# Patient Record
Sex: Female | Born: 1955 | Race: White | Hispanic: No | Marital: Married | Smoking: Never smoker
Health system: Southern US, Community
[De-identification: ages and names within clinical notes are randomized; demographics above are authoritative.]

## PROBLEM LIST (undated history)

## (undated) DIAGNOSIS — E0590 Thyrotoxicosis, unspecified without thyrotoxic crisis or storm: Secondary | ICD-10-CM

## (undated) DIAGNOSIS — I4819 Other persistent atrial fibrillation: Secondary | ICD-10-CM

## (undated) DIAGNOSIS — I480 Paroxysmal atrial fibrillation: Secondary | ICD-10-CM

## (undated) DIAGNOSIS — I4729 Other ventricular tachycardia: Secondary | ICD-10-CM

## (undated) DIAGNOSIS — I491 Atrial premature depolarization: Secondary | ICD-10-CM

## (undated) DIAGNOSIS — I2510 Atherosclerotic heart disease of native coronary artery without angina pectoris: Secondary | ICD-10-CM

## (undated) DIAGNOSIS — I493 Ventricular premature depolarization: Secondary | ICD-10-CM

## (undated) DIAGNOSIS — I4719 Other supraventricular tachycardia: Secondary | ICD-10-CM

## (undated) DIAGNOSIS — E7800 Pure hypercholesterolemia, unspecified: Secondary | ICD-10-CM

## (undated) DIAGNOSIS — I471 Supraventricular tachycardia: Secondary | ICD-10-CM

## (undated) HISTORY — DX: Paroxysmal atrial fibrillation: I48.0

## (undated) HISTORY — DX: Thyrotoxicosis, unspecified without thyrotoxic crisis or storm: E05.90

## (undated) HISTORY — DX: Supraventricular tachycardia: I47.1

## (undated) HISTORY — DX: Ventricular premature depolarization: I49.3

## (undated) HISTORY — DX: Atrial premature depolarization: I49.1

## (undated) HISTORY — DX: Atherosclerotic heart disease of native coronary artery without angina pectoris: I25.10

## (undated) HISTORY — DX: Other persistent atrial fibrillation: I48.19

## (undated) HISTORY — DX: Other supraventricular tachycardia: I47.19

## (undated) HISTORY — DX: Other ventricular tachycardia: I47.29

## (undated) HISTORY — PX: OTHER SURGICAL HISTORY: SHX169

---

## 2005-12-29 ENCOUNTER — Other Ambulatory Visit: Admission: RE | Admit: 2005-12-29 | Discharge: 2005-12-29 | Payer: Self-pay | Admitting: Family Medicine

## 2006-11-25 ENCOUNTER — Encounter: Admission: RE | Admit: 2006-11-25 | Discharge: 2006-11-25 | Payer: Self-pay | Admitting: Otolaryngology

## 2008-07-21 ENCOUNTER — Other Ambulatory Visit: Admission: RE | Admit: 2008-07-21 | Discharge: 2008-07-21 | Payer: Self-pay | Admitting: Family Medicine

## 2011-09-04 ENCOUNTER — Other Ambulatory Visit: Payer: Self-pay | Admitting: Family Medicine

## 2011-09-04 DIAGNOSIS — Z1231 Encounter for screening mammogram for malignant neoplasm of breast: Secondary | ICD-10-CM

## 2011-09-25 ENCOUNTER — Ambulatory Visit
Admission: RE | Admit: 2011-09-25 | Discharge: 2011-09-25 | Disposition: A | Discharge status: Home / Self Care | Payer: Commercial Indemnity | Source: Ambulatory Visit | Attending: Family Medicine | Admitting: Family Medicine

## 2011-09-25 DIAGNOSIS — Z1231 Encounter for screening mammogram for malignant neoplasm of breast: Secondary | ICD-10-CM

## 2012-12-17 ENCOUNTER — Other Ambulatory Visit: Payer: Self-pay | Admitting: Family Medicine

## 2012-12-17 DIAGNOSIS — Z1231 Encounter for screening mammogram for malignant neoplasm of breast: Secondary | ICD-10-CM

## 2013-01-17 ENCOUNTER — Ambulatory Visit
Admission: RE | Admit: 2013-01-17 | Discharge: 2013-01-17 | Disposition: A | Discharge status: Home / Self Care | Payer: Commercial Indemnity | Source: Ambulatory Visit | Attending: Family Medicine | Admitting: Family Medicine

## 2013-01-17 DIAGNOSIS — Z1231 Encounter for screening mammogram for malignant neoplasm of breast: Secondary | ICD-10-CM

## 2014-01-02 ENCOUNTER — Other Ambulatory Visit (HOSPITAL_COMMUNITY)
Admission: RE | Admit: 2014-01-02 | Discharge: 2014-01-02 | Disposition: A | Discharge status: Home / Self Care | Payer: Commercial Indemnity | Source: Ambulatory Visit | Attending: Family Medicine | Admitting: Family Medicine

## 2014-01-02 ENCOUNTER — Other Ambulatory Visit: Payer: Self-pay | Admitting: Family Medicine

## 2014-01-02 DIAGNOSIS — Z0000 Encounter for general adult medical examination without abnormal findings: Secondary | ICD-10-CM | POA: Insufficient documentation

## 2014-01-24 ENCOUNTER — Other Ambulatory Visit: Payer: Self-pay

## 2014-01-24 DIAGNOSIS — Z1231 Encounter for screening mammogram for malignant neoplasm of breast: Secondary | ICD-10-CM

## 2014-02-13 ENCOUNTER — Ambulatory Visit
Admission: RE | Admit: 2014-02-13 | Discharge: 2014-02-13 | Disposition: A | Discharge status: Home / Self Care | Payer: Commercial Indemnity | Source: Ambulatory Visit

## 2014-02-13 DIAGNOSIS — Z1231 Encounter for screening mammogram for malignant neoplasm of breast: Secondary | ICD-10-CM

## 2014-10-31 ENCOUNTER — Ambulatory Visit (INDEPENDENT_AMBULATORY_CARE_PROVIDER_SITE_OTHER): Payer: Commercial Indemnity

## 2014-10-31 ENCOUNTER — Encounter: Payer: Self-pay | Admitting: Podiatry

## 2014-10-31 ENCOUNTER — Ambulatory Visit (INDEPENDENT_AMBULATORY_CARE_PROVIDER_SITE_OTHER): Payer: Commercial Indemnity | Admitting: Podiatry

## 2014-10-31 VITALS — BP 132/74 | HR 71 | Resp 16

## 2014-10-31 DIAGNOSIS — M722 Plantar fascial fibromatosis: Secondary | ICD-10-CM

## 2014-10-31 MED ORDER — MELOXICAM 15 MG PO TABS: 15.0000 mg | ORAL_TABLET | Freq: Every day | ORAL | Status: DC

## 2014-10-31 MED ORDER — METHYLPREDNISOLONE (PAK) 4 MG PO TABS: ORAL_TABLET | ORAL | Status: DC

## 2014-10-31 NOTE — Patient Instructions (Signed)

## 2014-10-31 NOTE — Progress Notes (Signed)
   Subjective:    Patient ID: Julia Wilkerson, female    DOB: 1955-12-01, 58 y.o.   MRN: [id number]  HPI Comments: "I have pain in the heels"  Patient c/o aching plantar heel bilateral, left over right for a few months. She has AM pain. PCP Rx'd naprosyn-helped initially but not now. She has also tried ice, stretching, different shoes. Some better.     Review of Systems  HENT: Positive for hearing loss and tinnitus.   Neurological: Positive for dizziness.  All other systems reviewed and are negative.      Objective:   Physical Exam: I have reviewed her past medical history medications allergies surgery social history and review of systems. Pulses are strongly palpable bilateral. Neurologic sensorium is intact per Semmes-Weinstein monofilament. Deep tendon reflexes are intact bilateral muscle strength is 5 over 5 dorsiflexion plantar flexors and everters and inverters and his musculature is intact. Orthopedic evaluation of his drains all joints distal to the ankle range of motion without crepitation. She has pain on palpation medially continue to remove all the bilateral heels left greater than right. Radiographs evaluation confirm soft tissue increase in density of plantar fascial insertion site.        Assessment & Plan:  Assessment chronic proximal plantar fasciitis bilateral.  Plan: Start her on a Medrol Dosepak to be followed by meloxicam. Injected the bilateral heels today with Kenalog and local anesthetic. Bilateral plantar fascial braces were dispensed. Dispensed a night splint for the left foot. Discussed appropriate shoe gear stretching exercises ice therapy and shoe gear modification. We discussed the etiology pathology conservative versus surgical therapies. I will follow-up with her in 1 month.

## 2014-11-30 ENCOUNTER — Ambulatory Visit (INDEPENDENT_AMBULATORY_CARE_PROVIDER_SITE_OTHER): Payer: Commercial Indemnity | Admitting: Podiatry

## 2014-11-30 VITALS — BP 122/78 | HR 73 | Resp 16

## 2014-11-30 DIAGNOSIS — M722 Plantar fascial fibromatosis: Secondary | ICD-10-CM

## 2014-12-01 NOTE — Progress Notes (Signed)
She presents today for 4 week follow-up of plantar fasciitis bilateral. She states that she is nearly 100% improved. She continues CONSERVATIVE therapies.  Objective: Vital signs are stable she is alert and oriented 3. Pulses are strongly palpable bilateral. She has mild tenderness on palpation medial calcaneal tubercle. She states that this is improving on a daily basis.  Assessment: Resolving plantar fasciitis bilateral.  Plan: She will continue all conservative therapies until she was 100% +1 month. I encouraged her to notify us should she regress at all at which time orthotics will be made.

## 2015-03-16 ENCOUNTER — Ambulatory Visit (INDEPENDENT_AMBULATORY_CARE_PROVIDER_SITE_OTHER): Payer: Commercial Indemnity

## 2015-03-16 ENCOUNTER — Encounter: Payer: Self-pay | Admitting: Podiatry

## 2015-03-16 ENCOUNTER — Ambulatory Visit (INDEPENDENT_AMBULATORY_CARE_PROVIDER_SITE_OTHER): Payer: Commercial Indemnity | Admitting: Podiatry

## 2015-03-16 VITALS — BP 134/80 | HR 67 | Resp 12

## 2015-03-16 DIAGNOSIS — M8430XA Stress fracture, unspecified site, initial encounter for fracture: Secondary | ICD-10-CM

## 2015-03-16 DIAGNOSIS — M722 Plantar fascial fibromatosis: Secondary | ICD-10-CM

## 2015-03-16 MED ORDER — TRIAMCINOLONE ACETONIDE 10 MG/ML IJ SUSP
10.0000 mg | Freq: Once | INTRAMUSCULAR | Status: AC
  Administered 2015-03-16: 10 mg

## 2015-03-17 NOTE — Progress Notes (Signed)
Subjective:     Patient ID: Julia Wilkerson, female   DOB: 1956-09-13, 59 y.o.   MRN: [id number]  HPI patient states her right heel started to hurt her more more in the center in the outside and the left top of the foot has become tender over the last few weeks   Review of Systems     Objective:   Physical Exam Neurovascular status intact with no health history changes noted and discomfort in the lateral aspect of the right heel and center part of the right heel. Discomfort in the dorsum of the left with fluid buildup around the second and third metatarsal shafts    Assessment:     Reoccurring plantar fasciitis right and possibility for stress fracture or inflammatory condition left    Plan:     H&P and x-rays reviewed and today I injected the right plantar fascia 3 mg Kenalog 5 mill grams Xylocaine from a lateral direction. For the left foot reviewed x-rays and she will utilize home immobilization and if symptoms were to worsen we will need to consider a cast for this patient

## 2015-06-18 ENCOUNTER — Emergency Department (HOSPITAL_COMMUNITY): Payer: Commercial Indemnity

## 2015-06-18 ENCOUNTER — Observation Stay (HOSPITAL_COMMUNITY)
Admission: EM | Admit: 2015-06-18 | Discharge: 2015-06-21 | Disposition: A | Discharge status: Home / Self Care | Payer: Commercial Indemnity | Attending: Internal Medicine | Admitting: Internal Medicine

## 2015-06-18 ENCOUNTER — Encounter (HOSPITAL_COMMUNITY): Payer: Self-pay | Admitting: Emergency Medicine

## 2015-06-18 ENCOUNTER — Observation Stay (HOSPITAL_COMMUNITY): Payer: Commercial Indemnity

## 2015-06-18 DIAGNOSIS — R0789 Other chest pain: Secondary | ICD-10-CM | POA: Insufficient documentation

## 2015-06-18 DIAGNOSIS — E0590 Thyrotoxicosis, unspecified without thyrotoxic crisis or storm: Secondary | ICD-10-CM | POA: Insufficient documentation

## 2015-06-18 DIAGNOSIS — Z7982 Long term (current) use of aspirin: Secondary | ICD-10-CM | POA: Diagnosis not present

## 2015-06-18 DIAGNOSIS — R9431 Abnormal electrocardiogram [ECG] [EKG]: Secondary | ICD-10-CM

## 2015-06-18 DIAGNOSIS — Z79899 Other long term (current) drug therapy: Secondary | ICD-10-CM | POA: Insufficient documentation

## 2015-06-18 DIAGNOSIS — Z888 Allergy status to other drugs, medicaments and biological substances status: Secondary | ICD-10-CM | POA: Diagnosis not present

## 2015-06-18 DIAGNOSIS — R079 Chest pain, unspecified: Secondary | ICD-10-CM | POA: Diagnosis present

## 2015-06-18 DIAGNOSIS — E780 Pure hypercholesterolemia: Secondary | ICD-10-CM | POA: Diagnosis not present

## 2015-06-18 DIAGNOSIS — I4819 Other persistent atrial fibrillation: Secondary | ICD-10-CM | POA: Diagnosis present

## 2015-06-18 DIAGNOSIS — Z880 Allergy status to penicillin: Secondary | ICD-10-CM | POA: Diagnosis not present

## 2015-06-18 DIAGNOSIS — I480 Paroxysmal atrial fibrillation: Principal | ICD-10-CM | POA: Insufficient documentation

## 2015-06-18 DIAGNOSIS — R0602 Shortness of breath: Secondary | ICD-10-CM | POA: Insufficient documentation

## 2015-06-18 HISTORY — DX: Pure hypercholesterolemia, unspecified: E78.00

## 2015-06-18 LAB — CBC
HCT: 40.8 % (ref 36.0–46.0)
HEMOGLOBIN: 13.6 g/dL (ref 12.0–15.0)
MCH: 29.8 pg (ref 26.0–34.0)
MCHC: 33.3 g/dL (ref 30.0–36.0)
MCV: 89.3 fL (ref 78.0–100.0)
Platelets: 197 10*3/uL (ref 150–400)
RBC: 4.57 MIL/uL (ref 3.87–5.11)
RDW: 12.3 % (ref 11.5–15.5)
WBC: 4.6 10*3/uL (ref 4.0–10.5)

## 2015-06-18 LAB — BASIC METABOLIC PANEL
Anion gap: 7 (ref 5–15)
BUN: 9 mg/dL (ref 6–20)
CALCIUM: 9.8 mg/dL (ref 8.9–10.3)
CHLORIDE: 103 mmol/L (ref 101–111)
CO2: 28 mmol/L (ref 22–32)
Creatinine, Ser: 0.63 mg/dL (ref 0.44–1.00)
GFR calc non Af Amer: >60 mL/min (ref >60)
GLUCOSE: 108 mg/dL — AB (ref 65–99)
Potassium: 3.7 mmol/L (ref 3.5–5.1)
SODIUM: 138 mmol/L (ref 135–145)

## 2015-06-18 LAB — I-STAT TROPONIN, ED: Troponin i, poc: 0 ng/mL (ref 0.00–0.08)

## 2015-06-18 LAB — TROPONIN I
Troponin I: <0.03 ng/mL (ref <0.031)
Troponin I: <0.03 ng/mL (ref <0.031)
Troponin I: <0.03 ng/mL (ref <0.031)

## 2015-06-18 LAB — D-DIMER, QUANTITATIVE: D-Dimer, Quant: 0.51 ug/mL-FEU — ABNORMAL HIGH (ref 0.00–0.48)

## 2015-06-18 MED ORDER — HEPARIN SODIUM (PORCINE) 5000 UNIT/ML IJ SOLN: 5000.0000 [IU] | Freq: Three times a day (TID) | INTRAMUSCULAR | Status: DC

## 2015-06-18 MED ORDER — ASPIRIN 81 MG PO CHEW
324.0000 mg | CHEWABLE_TABLET | Freq: Once | ORAL | Status: AC
  Administered 2015-06-18: 324 mg via ORAL
  Filled 2015-06-18: qty 4

## 2015-06-18 MED ORDER — SODIUM CHLORIDE 0.9 % IV BOLUS (SEPSIS)
1000.0000 mL | Freq: Once | INTRAVENOUS | Status: AC
  Administered 2015-06-18: 1000 mL via INTRAVENOUS

## 2015-06-18 MED ORDER — ONDANSETRON HCL 4 MG/2ML IJ SOLN: 4.0000 mg | Freq: Four times a day (QID) | INTRAMUSCULAR | Status: DC | PRN

## 2015-06-18 MED ORDER — PANTOPRAZOLE SODIUM 40 MG PO TBEC
40.0000 mg | DELAYED_RELEASE_TABLET | Freq: Every day | ORAL | Status: DC
  Administered 2015-06-18 – 2015-06-21 (×4): 40 mg via ORAL
  Filled 2015-06-18 (×4): qty 1

## 2015-06-18 MED ORDER — METOPROLOL TARTRATE 25 MG PO TABS
12.5000 mg | ORAL_TABLET | Freq: Two times a day (BID) | ORAL | Status: DC
  Administered 2015-06-18 – 2015-06-19 (×2): 12.5 mg via ORAL
  Filled 2015-06-18 (×2): qty 1

## 2015-06-18 MED ORDER — ACETAMINOPHEN 325 MG PO TABS: 650.0000 mg | ORAL_TABLET | Freq: Four times a day (QID) | ORAL | Status: DC | PRN

## 2015-06-18 MED ORDER — NITROGLYCERIN 0.4 MG SL SUBL
0.4000 mg | SUBLINGUAL_TABLET | SUBLINGUAL | Status: DC | PRN
  Administered 2015-06-18: 0.4 mg via SUBLINGUAL
  Filled 2015-06-18: qty 1

## 2015-06-18 MED ORDER — ACETAMINOPHEN 325 MG PO TABS
650.0000 mg | ORAL_TABLET | ORAL | Status: DC | PRN
  Administered 2015-06-20: 650 mg via ORAL
  Filled 2015-06-18: qty 2

## 2015-06-18 MED ORDER — IOHEXOL 350 MG/ML SOLN
100.0000 mL | Freq: Once | INTRAVENOUS | Status: AC | PRN
  Administered 2015-06-18: 100 mL via INTRAVENOUS

## 2015-06-18 MED ORDER — GI COCKTAIL ~~LOC~~: 30.0000 mL | Freq: Four times a day (QID) | ORAL | Status: DC | PRN

## 2015-06-18 MED ORDER — HEPARIN SODIUM (PORCINE) 5000 UNIT/ML IJ SOLN
5000.0000 [IU] | Freq: Three times a day (TID) | INTRAMUSCULAR | Status: DC
  Filled 2015-06-18: qty 1

## 2015-06-18 MED ORDER — ASPIRIN 325 MG PO TABS
325.0000 mg | ORAL_TABLET | Freq: Every day | ORAL | Status: DC
  Administered 2015-06-19: 325 mg via ORAL
  Filled 2015-06-18 (×2): qty 1

## 2015-06-18 NOTE — ED Notes (Signed)
Pt states that around 8am this morning she started having central chest pressure with SHOB.  Pt states that she has high cholesterol but denies any other PMH.  Pt states that she took 2  ASA this morning.

## 2015-06-18 NOTE — ED Notes (Signed)
Pt can go to 13:40.Marland KitchenMarland Kitchen

## 2015-06-18 NOTE — H&P (Signed)
[facility] History and Physical  Julia Wilkerson ZOX:[id number] DOB: 1956/02/05 DOA: 06/18/2015  Referring physician: Dr. Lawrence Marseilles PCP: Cala Bradford, MD   Chief Complaint: chest pressure  HPI: Julia Wilkerson is a 59 y.o. female  with no significant past medical history that comes in for shortness of breath and chest pain that started the day of admission. She relates that he started with chest discomfort that has progressed to tightness. She relates nothing makes it better or worst. She relates some radiation to her neck but this has resolved. She denies any nausea, vomiting, diarrhea, change in her medication, no leg swelling, no recent traveling or surgeries. She is shortness of breath has improved she relates exertion did not make it worse and requested not make it better.   In the ED: An EKG was done that shows A. fib rate controlled, nonspecific T-wave changes first set of cardiac markers was negative 1. CT image of the chest was done that showed no PE.  Review of Systems:  Constitutional:  No weight loss, night sweats, Fevers, chills, fatigue.  HEENT:  No headaches, Difficulty swallowing,Tooth/dental problems,Sore throat,  No sneezing, itching, ear ache, nasal congestion, post nasal drip,  Cardio-vascular:  Orthopnea, PND, swelling in lower extremities, anasarca, dizziness, palpitations  GI:  No heartburn, indigestion, abdominal pain, nausea, vomiting, diarrhea, change in bowel habits, loss of appetite  Resp:  No excess mucus, no productive cough, No non-productive cough, No coughing up of blood.No change in color of mucus.No wheezing.No chest wall deformity  Skin:  no rash or lesions.  GU:  no dysuria, change in color of urine, no urgency or frequency. No flank pain.  Musculoskeletal:  No joint pain or swelling. No decreased range of motion. No back pain.  Psych:  No change in mood or affect. No depression or anxiety. No memory loss.   Past Medical History    Diagnosis Date  . High cholesterol    History reviewed. No pertinent past surgical history. Social History:  reports that she has never smoked. She has never used smokeless tobacco. She reports that she does not drink alcohol. Her drug history is not on file.  Allergies  Allergen Reactions  . Lamisil [Terbinafine Hcl] Other (See Comments)    Lost taste   . Penicillins Hives  . Septra [Sulfamethoxazole-Trimethoprim] Hives  . Azo [Phenazopyridine] Rash    Family History  Problem Relation Age of Onset  . Heart failure Mother   . Heart attack Mother   . Osteopenia Mother     Prior to Admission medications   Medication Sig Start Date End Date Taking? Authorizing Provider  ibuprofen (ADVIL,MOTRIN) 200 MG tablet Take 800 mg by mouth every 4 (four) hours as needed for fever, headache, mild pain, moderate pain or cramping.   Yes Historical Provider, MD   Physical Exam: Filed Vitals:   06/18/15 1031 06/18/15 1228 06/18/15 1351  BP: 125/82 117/70 139/68  Pulse: 113 87 91  Temp: 97.6 F (36.4 C)  97.7 F (36.5 C)  TempSrc: Oral  Oral  Resp: SpO2: 100% 98% 96%    Wt Readings from Last 3 Encounters:  No data found for Wt    General:  Appears calm and comfortable Eyes: PERRL, normal lids, irises & conjunctiva ENT: grossly normal hearing, lips & tongue Neck: no LAD, masses or thyromegaly Cardiovascular: RRR, no m/r/g. No LE edema. Telemetry: SR, no arrhythmias  Respiratory: CTA bilaterally, no w/r/r. Normal respiratory effort. Abdomen: soft, ntnd Skin: no rash  or induration seen on limited exam Musculoskeletal: grossly normal tone BUE/BLE Psychiatric: grossly normal mood and affect, speech fluent and appropriate Neurologic: grossly non-focal.          Labs on Admission:  Basic Metabolic Panel:  Recent Labs Lab 06/18/15 1058  NA 138  K 3.7  CL 103  CO2 28  GLUCOSE 108*  BUN 9  CREATININE 0.63  CALCIUM 9.8   Liver Function Tests: No results for  input(s): AST, ALT, ALKPHOS, BILITOT, PROT, ALBUMIN in the last 168 hours. No results for input(s): LIPASE, AMYLASE in the last 168 hours. No results for input(s): AMMONIA in the last 168 hours. CBC:  Recent Labs Lab 06/18/15 1058  WBC 4.6  HGB 13.6  HCT 40.8  MCV 89.3  PLT 197   Cardiac Enzymes: No results for input(s): CKTOTAL, CKMB, CKMBINDEX, TROPONINI in the last 168 hours.  BNP (last 3 results) No results for input(s): BNP in the last 8760 hours.  ProBNP (last 3 results) No results for input(s): PROBNP in the last 8760 hours.  CBG: No results for input(s): GLUCAP in the last 168 hours.  Radiological Exams on Admission: Dg Chest 2 View  06/18/2015   CLINICAL DATA:  Sternal chest pain and shortness of breath  EXAM: CHEST  2 VIEW  COMPARISON:  None.  FINDINGS: Normal heart size and mediastinal contours. No acute infiltrate or edema. No effusion or pneumothorax. No acute osseous findings.  IMPRESSION: No active cardiopulmonary disease.   Electronically Signed   By: Marnee Spring M.D.   On: 06/18/2015 10:59   Ct Angio Chest Pe W/cm &/or Wo Cm  06/18/2015   CLINICAL DATA:  Shortness of breath and chest pressure  EXAM: CT ANGIOGRAPHY CHEST WITH CONTRAST  TECHNIQUE: Multidetector CT imaging of the chest was performed using the standard protocol during bolus administration of intravenous contrast. Multiplanar CT image reconstructions and MIPs were obtained to evaluate the vascular anatomy.  CONTRAST:  OMNIPAQUE IOHEXOL 350 MG/ML SOLN  COMPARISON:  June 18, 2015 chest radiograph.  FINDINGS: There is no demonstrable pulmonary embolus. There is no thoracic aortic aneurysm or dissection. Visualized great vessels appear unremarkable.  There is a small bulla in the inferior aspect of the anterior segment right upper lobe. There is no edema or consolidation. The visualized thyroid appears normal. There is no appreciable thoracic adenopathy. The pericardium is not thickened.  Visualized  upper abdominal structures appear unremarkable.  There are no blastic or lytic bone lesions.  Review of the MIP images confirms the above findings.  IMPRESSION: No demonstrable pulmonary embolus. No edema or consolidation. No appreciable thoracic adenopathy.   Electronically Signed   By: Bretta Bang III M.D.   On: 06/18/2015 12:09    EKG: Independently reviewed. A fib rate controlled, nonspecific T-wave changes  Assessment/Plan Chest pain - Admit to telemetry place her nothing by mouth after midnight, cycle cardiac enzymes 3. Get a 2-D echo. I will already consulted cardiology for possible stress test in the morning. - We will start her on metoprolol orally low dose. - Start her empirically on a PPI.  Paroxysmal a-fib - Her calculated CHADS2 VASC score is 1. - Start her on aspirin 325. Awaiting 2-D echo.  - Start her on Low-dose metoprolol. - Cardiology already consulted.    Code Status: full DVT Prophylaxis:heparin  Family Communication: husband Disposition Plan: observation  Time spent: 60 min  FELIZ Rosine Beat [facility] Pager [phone]

## 2015-06-18 NOTE — ED Provider Notes (Signed)
CSN: [id number]     Arrival date & time 06/18/15  1018 History   First MD Initiated Contact with Patient 06/18/15 1033     Chief Complaint  Patient presents with  . Chest Pain     (Consider location/radiation/quality/duration/timing/severity/associated sxs/prior Treatment) HPI  59 year old female presents with shortness of breath and chest pain that started this morning around 8 AM. Patient states that it started first with shortness of breath and then progressed to chest tightness, heaviness, and aching sensation. Earlier she was having pain in her neck and upper back but states that has resolved. No nausea, vomiting, or diaphoresis. No leg pain, leg swelling, recent travel, recent surgery. Patient states there is a mild pleuritic component. Right now her pain is mild and rates it as a 3/10. Patient took 2 baby aspirin this morning. Patient has a history of high cholesterol denies smoking, hypertension, diabetes, or family history of early coronary disease. Has had some palpitations intermittently.   Past Medical History  Diagnosis Date  . High cholesterol    History reviewed. No pertinent past surgical history. No family history on file. History  Substance Use Topics  . Smoking status: Never Smoker   . Smokeless tobacco: Not on file  . Alcohol Use: No   OB History    No data available     Review of Systems  Respiratory: Positive for chest tightness and shortness of breath. Negative for cough.   Cardiovascular: Positive for chest pain. Negative for leg swelling.  Gastrointestinal: Negative for nausea, vomiting and abdominal pain.  All other systems reviewed and are negative.     Allergies  Azo; Lamisil; Penicillins; and Septra  Home Medications   Prior to Admission medications   Medication Sig Start Date End Date Taking? Authorizing Provider  atorvastatin (LIPITOR) 20 MG tablet Take 20 mg by mouth daily.    Historical Provider, MD  Calcium Carbonate-Vitamin D (CALCIUM +  D PO) Take by mouth.    Historical Provider, MD  chlorthalidone (HYGROTON) 25 MG tablet Take 25 mg by mouth daily.    Historical Provider, MD  Cholecalciferol (VITAMIN D PO) Take by mouth.    Historical Provider, MD  ciprofloxacin (CIPRO) 500 MG tablet  01/31/15   Historical Provider, MD  meloxicam (MOBIC) 15 MG tablet Take 1 tablet (15 mg total) by mouth daily. 10/31/14   Max T Hyatt, DPM  methylPREDNIsolone (MEDROL DOSPACK) 4 MG tablet follow package directions 10/31/14   Max T Leroy, DPM  Multiple Vitamin (MULTIVITAMIN) capsule Take 1 capsule by mouth daily.    Historical Provider, MD  naproxen (NAPROSYN) 500 MG tablet Take 500 mg by mouth 2 (two) times daily with a meal.    Historical Provider, MD  Omega-3 Fatty Acids (FISH OIL PO) Take by mouth.    Historical Provider, MD  potassium chloride (MICRO-K) 10 MEQ CR capsule Take 10 mEq by mouth 2 (two) times daily.    Historical Provider, MD   BP 125/82 mmHg  Pulse 113  Temp(Src) 97.6 F (36.4 C) (Oral)  Resp 17  SpO2 100% Physical Exam  Constitutional: She is oriented to person, place, and time. She appears well-developed and well-nourished.  HENT:  Head: Normocephalic and atraumatic.  Right Ear: External ear normal.  Left Ear: External ear normal.  Nose: Nose normal.  Eyes: Right eye exhibits no discharge. Left eye exhibits no discharge.  Cardiovascular: Normal rate, regular rhythm and normal heart sounds.   Pulmonary/Chest: Effort normal and breath sounds normal. She exhibits tenderness.  Abdominal: Soft. There is no tenderness.  Musculoskeletal: She exhibits no edema or tenderness.  Neurological: She is alert and oriented to person, place, and time.  Skin: Skin is warm and dry. She is not diaphoretic.  Nursing note and vitals reviewed.   ED Course  Procedures (including critical care time) Labs Review Labs Reviewed  BASIC METABOLIC PANEL - Abnormal; Notable for the following:    Glucose, Bld 108 (*)    All other  components within normal limits  D-DIMER, QUANTITATIVE (NOT AT [facility]) - Abnormal; Notable for the following:    D-Dimer, Quant 0.51 (*)    All other components within normal limits  CBC  TROPONIN I  TROPONIN I  TROPONIN I  I-STAT TROPOININ, ED    Imaging Review Dg Chest 2 View  06/18/2015   CLINICAL DATA:  Sternal chest pain and shortness of breath  EXAM: CHEST  2 VIEW  COMPARISON:  None.  FINDINGS: Normal heart size and mediastinal contours. No acute infiltrate or edema. No effusion or pneumothorax. No acute osseous findings.  IMPRESSION: No active cardiopulmonary disease.   Electronically Signed   By: Marnee Spring M.D.   On: 06/18/2015 10:59   Ct Angio Chest Pe W/cm &/or Wo Cm  06/18/2015   CLINICAL DATA:  Shortness of breath and chest pressure  EXAM: CT ANGIOGRAPHY CHEST WITH CONTRAST  TECHNIQUE: Multidetector CT imaging of the chest was performed using the standard protocol during bolus administration of intravenous contrast. Multiplanar CT image reconstructions and MIPs were obtained to evaluate the vascular anatomy.  CONTRAST:  OMNIPAQUE IOHEXOL 350 MG/ML SOLN  COMPARISON:  June 18, 2015 chest radiograph.  FINDINGS: There is no demonstrable pulmonary embolus. There is no thoracic aortic aneurysm or dissection. Visualized great vessels appear unremarkable.  There is a small bulla in the inferior aspect of the anterior segment right upper lobe. There is no edema or consolidation. The visualized thyroid appears normal. There is no appreciable thoracic adenopathy. The pericardium is not thickened.  Visualized upper abdominal structures appear unremarkable.  There are no blastic or lytic bone lesions.  Review of the MIP images confirms the above findings.  IMPRESSION: No demonstrable pulmonary embolus. No edema or consolidation. No appreciable thoracic adenopathy.   Electronically Signed   By: Bretta Bang III M.D.   On: 06/18/2015 12:09     EKG Interpretation   Date/Time:  Monday  June 18 2015 10:27:04 EDT Ventricular Rate:  108 PR Interval:  62 QRS Duration: 75 QT Interval:  340 QTC Calculation: 456 R Axis:   81 Text Interpretation:  Sinus or ectopic atrial tachycardia Multiple  premature complexes, vent  Borderline repolarization abnormality No old  tracing to compare Confirmed by Angeliyah Kirkey  MD, Luvena Wentling ([id number]) on 06/18/2015  10:33:13 AM       EKG Interpretation  Date/Time:  Monday June 18 2015 11:35:29 EDT Ventricular Rate:  98 PR Interval:  62 QRS Duration: 75 QT Interval:  341 QTC Calculation: 435 R Axis:   74 Text Interpretation:  Atrial fibrillation nonspecific T wave changes unchanged no significant change from 1 hour prior Confirmed by Danyle Boening  MD, Delaila Nand ([id number]) on 06/18/2015 11:40:14 AM       MDM   Final diagnoses:  Other chest pain    Patient with atypical chest pain but nonspecific T wave changes in the setting of likely new atrial flutter. Patient's rate is controlled with IV fluids. Pain is controlled after being given nitroglycerin and aspirin. Workup for pulmonary moles and  is negative. Her HEART  Scores of 4. After discussion with patient she agrees to be admitted overnight for ACS rule out. Stable for floor admission.    Pricilla Loveless, MD 06/18/15 [phone]

## 2015-06-19 ENCOUNTER — Encounter (HOSPITAL_COMMUNITY): Payer: Self-pay | Admitting: Physician Assistant

## 2015-06-19 ENCOUNTER — Other Ambulatory Visit: Payer: Self-pay | Admitting: Physician Assistant

## 2015-06-19 DIAGNOSIS — I480 Paroxysmal atrial fibrillation: Secondary | ICD-10-CM | POA: Diagnosis not present

## 2015-06-19 DIAGNOSIS — R072 Precordial pain: Secondary | ICD-10-CM

## 2015-06-19 DIAGNOSIS — E0590 Thyrotoxicosis, unspecified without thyrotoxic crisis or storm: Secondary | ICD-10-CM | POA: Diagnosis not present

## 2015-06-19 DIAGNOSIS — R079 Chest pain, unspecified: Secondary | ICD-10-CM | POA: Diagnosis not present

## 2015-06-19 DIAGNOSIS — R0789 Other chest pain: Secondary | ICD-10-CM | POA: Diagnosis not present

## 2015-06-19 LAB — TSH: TSH: 0.013 u[IU]/mL — AB (ref 0.350–4.500)

## 2015-06-19 LAB — T4, FREE: Free T4: 2.04 ng/dL — ABNORMAL HIGH (ref 0.61–1.12)

## 2015-06-19 MED ORDER — RIVAROXABAN 20 MG PO TABS
20.0000 mg | ORAL_TABLET | Freq: Once | ORAL | Status: AC
  Administered 2015-06-19: 20 mg via ORAL
  Filled 2015-06-19: qty 1

## 2015-06-19 MED ORDER — ENOXAPARIN SODIUM 80 MG/0.8ML ~~LOC~~ SOLN
75.0000 mg | Freq: Two times a day (BID) | SUBCUTANEOUS | Status: DC
Start: 2015-06-19 — End: 2015-06-19
  Administered 2015-06-19: 75 mg via SUBCUTANEOUS
  Filled 2015-06-19: qty 0.8

## 2015-06-19 MED ORDER — METOPROLOL TARTRATE 25 MG PO TABS
12.5000 mg | ORAL_TABLET | Freq: Once | ORAL | Status: AC
  Administered 2015-06-19: 12.5 mg via ORAL
  Filled 2015-06-19: qty 1

## 2015-06-19 MED ORDER — RIVAROXABAN 20 MG PO TABS
20.0000 mg | ORAL_TABLET | Freq: Every day | ORAL | Status: DC
  Administered 2015-06-20: 20 mg via ORAL
  Filled 2015-06-19: qty 1

## 2015-06-19 MED ORDER — METOPROLOL TARTRATE 25 MG PO TABS
25.0000 mg | ORAL_TABLET | Freq: Two times a day (BID) | ORAL | Status: DC
  Administered 2015-06-19 – 2015-06-21 (×4): 25 mg via ORAL
  Filled 2015-06-19 (×4): qty 1

## 2015-06-19 NOTE — Progress Notes (Addendum)
ANTICOAGULATION CONSULT NOTE - Initial Consult  Pharmacy Consult for lovenox Indication: atrial fibrillation  Allergies  Allergen Reactions  . Lamisil [Terbinafine Hcl] Other (See Comments)    Lost taste   . Penicillins Hives  . Septra [Sulfamethoxazole-Trimethoprim] Hives  . Azo [Phenazopyridine] Rash    Patient Measurements: Height:  (172.7 cm) Weight: 162 lb (73.483 kg) IBW/kg (Calculated) : 63.9  Vital Signs: Temp: 97.9 F (36.6 C) (08/02 0559) Temp Source: Oral (08/02 0559) BP: 119/66 mmHg (08/02 0559) Pulse Rate: 82 (08/02 0559)  Labs:  Recent Labs  06/18/15 1058 06/18/15 1505 06/18/15 1815 06/18/15 2120  HGB 13.6  --   --   --   HCT 40.8  --   --   --   PLT 197  --   --   --   CREATININE 0.63  --   --   --   TROPONINI  --  <0.03 <0.03 <0.03    Estimated Creatinine Clearance: 77.3 mL/min (by C-G formula based on Cr of 0.63).   Medical History: Past Medical History  Diagnosis Date  . High cholesterol    Assessment: Patient's a 59 y.o F presented to the ED on 8/1 with c/o chest pain and SOB. Cardiac enzymes negative, Chest CT angio neg for PE. To start lovenox for PAF.  Goal of Therapy:  Anti-Xa level 0.6-1 units/ml 4hrs after LMWH dose given Monitor platelets by anticoagulation protocol: Yes   Plan:  - lovenox  SQ q12h - cbc q72h  Pasqualina Colasurdo P 06/19/2015,9:40 AM  Adden: To change lovenox to xarelto for afib.  Lovenox dose given at 10 AM.  Will start xarelto  daily  (give first dose tonight at 2100). Dorna Leitz, PharmD, BCPS 06/19/2015 11:05 AM

## 2015-06-19 NOTE — Progress Notes (Signed)
Spoke with pt concerning Xarelto. Pt states, "I was approved for saving program with no co pay. For Xarelto."  Information called to [phone] Bjorn Loser, NP.

## 2015-06-19 NOTE — Discharge Instructions (Addendum)
Information on my medicine - ELIQUIS (apixaban)  This medication education was reviewed with me or my healthcare representative as part of my discharge preparation.  Why was Eliquis prescribed for you? Eliquis was prescribed for you to reduce the risk of a blood clot forming that can cause a stroke if you have a medical condition called atrial fibrillation (a type of irregular heartbeat).  What do You need to know about Eliquis ? Take your Eliquis TWICE DAILY - one tablet in the morning and one tablet in the evening with or without food. If you have difficulty swallowing the tablet whole please discuss with your pharmacist how to take the medication safely.  Take Eliquis exactly as prescribed by your doctor and DO NOT stop taking Eliquis without talking to the doctor who prescribed the medication.  Stopping may increase your risk of developing a stroke.  Refill your prescription before you run out.  After discharge, you should have regular check-up appointments with your healthcare provider that is prescribing your Eliquis.  In the future your dose may need to be changed if your kidney function or weight changes by a significant amount or as you get older.  What do you do if you miss a dose? If you miss a dose, take it as soon as you remember on the same day and resume taking twice daily.  Do not take more than one dose of ELIQUIS at the same time to make up a missed dose.  Important Safety Information A possible side effect of Eliquis is bleeding. You should call your healthcare provider right away if you experience any of the following: ? Bleeding from an injury or your nose that does not stop. ? Unusual colored urine (red or dark brown) or unusual colored stools (red or black). ? Unusual bruising for unknown reasons. ? A serious fall or if you hit your head (even if there is no bleeding).  Some medicines may interact with Eliquis and might increase your risk of bleeding or  clotting while on Eliquis. To help avoid this, consult your healthcare provider or pharmacist prior to using any new prescription or non-prescription medications, including herbals, vitamins, non-steroidal anti-inflammatory drugs (NSAIDs) and supplements.  This website has more information on Eliquis (apixaban): http://www.eliquis.com/eliquis/home  Go to Dr. Norris Cross office 06/22/15 to have 48 hour Holter Monitor placed at 1230 p - which will give [place] an idea of how your HR is doing at home.  You will see her PA back in about 2 weeks and further plans will be made then.  Please call if any problems.

## 2015-06-19 NOTE — Progress Notes (Addendum)
[facility] Progress Note   Julia Wilkerson ZOX:[id number] DOB: [birth year] DOA: 06/18/2015 PCP: Cala Bradford, MD  Brief narrative: Julia Wilkerson is a 59 y.o. female with no PMH developed chest tightness yesterday lasting a few hrs . She was not exerting herself when it started. She tells me it did not radiate. No dyspnea, nausea or diaphoresis.  It was improving on its own and resolved completely after Nitro given. She was found to have A-fib with RVR. She has not felt any palpitations.    Subjective: No complaints of chest pain or palpitations.  Assessment/Plan: Active Problems:   Chest pain - As been evaluated by cardiology and outpatient stress test scheduled for her    Paroxysmal a-fib -Lopressor increased today-per cardiology, if rate is well controlled tomorrow, she can go home -Cardiology recommending Xarelto and TEE with DC cardioversion on 8/5 - started on Xarelto by cardiology  Low TSH -Check Free T4   Appt with PCP: Requested Code Status: Full code Family Communication: Husband at bedside Disposition Plan: Hopefully home tomorrow DVT prophylaxis: Xarelto Consultants: Cardiology Procedures:  Antibiotics: Anti-infectives    None      Objective: Filed Weights   06/18/15 1500  Weight: 73.483 kg (162 lb)    Intake/Output Summary (Last 24 hours) at 06/19/15 0947 Last data filed at 06/19/15 0600  Gross per 24 hour  Intake    240 ml  Output    800 ml  Net   -560 ml     Vitals Filed Vitals:   06/18/15 1500 06/18/15 2127 06/19/15 0151 06/19/15 0559  BP:  97/60 115/71 119/66  Pulse:  92 94 82  Temp:  98 F (36.7 C) 97.7 F (36.5 C) 97.9 F (36.6 C)  TempSrc:  Oral Oral Oral  Resp:  18 18 18   Height: 5\' 8"  (1.727 m)     Weight: 73.483 kg (162 lb)     SpO2:  99% 100% 100%    Exam:  General:  Pt is alert, not in acute distress  HEENT: No icterus, No thrush, oral mucosa moist  Cardiovascular: regular rate and rhythm, S1/S2 No  murmur  Respiratory: clear to auscultation bilaterally   Abdomen: Soft, +Bowel sounds, non tender, non distended, no guarding  MSK: No LE edema, cyanosis or clubbing  Data Reviewed: Basic Metabolic Panel:  Recent Labs Lab 06/18/15 1058  NA 138  K 3.7  CL 103  CO2 28  GLUCOSE 108*  BUN 9  CREATININE 0.63  CALCIUM 9.8   Liver Function Tests: No results for input(s): AST, ALT, ALKPHOS, BILITOT, PROT, ALBUMIN in the last 168 hours. No results for input(s): LIPASE, AMYLASE in the last 168 hours. No results for input(s): AMMONIA in the last 168 hours. CBC:  Recent Labs Lab 06/18/15 1058  WBC 4.6  HGB 13.6  HCT 40.8  MCV 89.3  PLT 197   Cardiac Enzymes:  Recent Labs Lab 06/18/15 1505 06/18/15 1815 06/18/15 2120  TROPONINI <0.03 <0.03 <0.03   BNP (last 3 results) No results for input(s): BNP in the last 8760 hours.  ProBNP (last 3 results) No results for input(s): PROBNP in the last 8760 hours.  CBG: No results for input(s): GLUCAP in the last 168 hours.  No results found for this or any previous visit (from the past 240 hour(s)).   Studies: Dg Chest 2 View  06/18/2015   CLINICAL DATA:  Sternal chest pain and shortness of breath  EXAM: CHEST  2 VIEW  COMPARISON:  None.  FINDINGS:  Normal heart size and mediastinal contours. No acute infiltrate or edema. No effusion or pneumothorax. No acute osseous findings.  IMPRESSION: No active cardiopulmonary disease.   Electronically Signed   By: Marnee Spring M.D.   On: 06/18/2015 10:59   Ct Angio Chest Pe W/cm &/or Wo Cm  06/18/2015   CLINICAL DATA:  Shortness of breath and chest pressure  EXAM: CT ANGIOGRAPHY CHEST WITH CONTRAST  TECHNIQUE: Multidetector CT imaging of the chest was performed using the standard protocol during bolus administration of intravenous contrast. Multiplanar CT image reconstructions and MIPs were obtained to evaluate the vascular anatomy.  CONTRAST:  OMNIPAQUE IOHEXOL 350 MG/ML SOLN   COMPARISON:  June 18, 2015 chest radiograph.  FINDINGS: There is no demonstrable pulmonary embolus. There is no thoracic aortic aneurysm or dissection. Visualized great vessels appear unremarkable.  There is a small bulla in the inferior aspect of the anterior segment right upper lobe. There is no edema or consolidation. The visualized thyroid appears normal. There is no appreciable thoracic adenopathy. The pericardium is not thickened.  Visualized upper abdominal structures appear unremarkable.  There are no blastic or lytic bone lesions.  Review of the MIP images confirms the above findings.  IMPRESSION: No demonstrable pulmonary embolus. No edema or consolidation. No appreciable thoracic adenopathy.   Electronically Signed   By: Bretta Bang III M.D.   On: 06/18/2015 12:09    Scheduled Meds:  Scheduled Meds: . aspirin  325 mg Oral Daily  . metoprolol tartrate  12.5 mg Oral BID  . pantoprazole  40 mg Oral Daily   Continuous Infusions:   Time spent on care of this patient: 35 min   Malyn Aytes, MD 06/19/2015, 9:47 AM    [facility] Office  [phone] Pager - Text Page per [email or web address] If 7PM-7AM, please contact night-coverage [email or web address]

## 2015-06-19 NOTE — Progress Notes (Addendum)
Benefit Check revealed Xarelto  is a Preferred drug with a co- pay $100.00 max. Generic $15, Non-Preferred $150.00.  This information shared with NP and pt and Xarelto [facility] Trial Offer for free 30- Days given to pt.  Pt was asked to call Xarelto [phone] to see if she qualifies for any assistance programs.

## 2015-06-19 NOTE — Progress Notes (Signed)
Patient called her insurance and states she qualifies for xarelto savings program.  Earnest Conroy. Clelia Croft, RN

## 2015-06-19 NOTE — Progress Notes (Addendum)
Patient walked into bathroom and changed her gown. Heart rate went up to 140's. BP 122/60, patient asymptomatic. HR returned to 90-100 when back in bed   Julia Wilkerson. Clelia Croft, RN

## 2015-06-19 NOTE — Progress Notes (Addendum)
    [facility] has been requested to perform a transesophageal echocardiogram on 06/22/2015 for Atrial fibrillation.  After careful review of history and examination, the risks and benefits of transesophageal echocardiogram have been explained including risks of esophageal damage, perforation (1:10,000 risk), bleeding, pharyngeal hematoma as well as other potential complications associated with conscious sedation including aspiration, arrhythmia, respiratory failure and death. Alternatives to treatment were discussed, questions were answered. Patient is willing to proceed.   Contacted endo, orders written for Friday as OP procedure.  Leanna Battles 06/19/2015 5:06 PM

## 2015-06-19 NOTE — Progress Notes (Signed)
Patient watched a.fib video. Rn answered questions. RN reviewed A.Fib print carenote with patient . Earnest Conroy. Clelia Croft, RN

## 2015-06-19 NOTE — Consult Note (Signed)
CARDIOLOGY CONSULT NOTE   Patient ID: Julia Wilkerson MRN: [id number] DOB/AGE: [birth year] 59 y.o.  Admit date: 06/18/2015  Primary Physician   Cala Bradford, MD Primary Cardiologist   New Reason for Consultation   Chest pain  NWG:[id number] [name] is a 59 y.o. year old female with a history of HL but no cardiac issues was admitted with chest pain and was in atrial fibrillation, cardiology asked to evaluate.  Pt had onset of chest tightness upper middle chest and SOB about 8 am yesterday. No change with deep inspiration, started with minimal activity. No N&V or diaphoresis. She has had SOB before, but never the tightness. SOB would last about 10 minutes. The SOB was relieved by sitting up if she was lying down or resting if she was active. The chest tightness was not affected by any of this. She took ASA, no other meds. Peak intensity 6-7/10.   She came to the hospital about mid-morning when the tightness did not resolve, and was given SL NTG x 1 with some relief, but the symptoms had already started to ease off.  She was given protonix and metoprolol as well.  She has no awareness of the atrial fibrillation. No palpitations, presyncope or syncope. She occasionally takes her BP and on 07/31, her HR was unusually high, > 100 and her BP was lower than usual, with SBP 99. It stayed that way overnight.   She has a history of episodic SOB, with or without exertion, episodes are brief and resolve spontaneously. Those may have been more frequent recently. She was more unusually tired the evening of 07/31 and all day yesterday.    Past Medical History  Diagnosis Date  . High cholesterol      Past Surgical History  Procedure Laterality Date  . None      Allergies  Allergen Reactions  . Lamisil [Terbinafine Hcl] Other (See Comments)    Lost taste   . Penicillins Hives  . Septra [Sulfamethoxazole-Trimethoprim] Hives  . Azo [Phenazopyridine] Rash    I have reviewed the patient's  current medications . aspirin  325 mg Oral Daily  . heparin  5,000 Units Subcutaneous 3 times per day  . metoprolol tartrate  12.5 mg Oral BID  . pantoprazole  40 mg Oral Daily     acetaminophen, gi cocktail, nitroGLYCERIN, ondansetron (ZOFRAN) IV  Prior to Admission medications   Medication Sig Start Date End Date Taking? Authorizing Provider  ibuprofen (ADVIL,MOTRIN) 200 MG tablet Take 800 mg by mouth every 4 (four) hours as needed for fever, headache, mild pain, moderate pain or cramping.   Yes Historical Provider, MD     History   Social History  . Marital Status: Married    Spouse Name: N/A  . Number of Children: N/A  . Years of Education: N/A   Occupational History  . Not employed    Social History Main Topics  . Smoking status: Never Smoker   . Smokeless tobacco: Never Used  . Alcohol Use: No  . Drug Use: Not on file  . Sexual Activity: Yes   Other Topics Concern  . Not on file   Social History Narrative   Lives with husband and her mother (cares for her mother).    Family Status  Relation Status Death Age  . Mother Alive     Born [phone]  . Father Deceased    Family History  Problem Relation Age of Onset  . Glaucoma Mother   . Heart  attack Mother     MI in her 73s  . Osteopenia Mother   . Hypothyroidism Mother      ROS: She had some LE edema 2 days ago, unusual for her. Full 14 point review of systems complete and found to be negative unless listed above.  Physical Exam: Blood pressure 119/66, pulse 82, temperature 97.9 F (36.6 C), temperature source Oral, resp. rate 18, height 5\' 8"  (1.727 m), weight 162 lb (73.483 kg), SpO2 100 %.  General: Well developed, well nourished, female in no acute distress Head: Eyes PERRLA, No xanthomas.   Normocephalic and atraumatic, oropharynx without edema or exudate. Dentition: poor Lungs: CTA bilaterally Heart: Heart irregular rate and rhythm with S1, S2, no murmur. pulses are 2+ all 4 extrem.   Neck: No carotid  bruits. No lymphadenopathy.  JVD not elevated. Abdomen: Bowel sounds present, abdomen soft and non-tender without masses or hernias noted. Msk:  No spine or cva tenderness. No weakness, no joint deformities or effusions. Extremities: No clubbing or cyanosis. No edema.  Neuro: Alert and oriented X 3. No focal deficits noted. Psych:  Good affect, responds appropriately Skin: No rashes or lesions noted.  Labs:   Lab Results  Component Value Date   WBC 4.6 06/18/2015   HGB 13.6 06/18/2015   HCT 40.8 06/18/2015   MCV 89.3 06/18/2015   PLT 197 06/18/2015    Recent Labs Lab 06/18/15 1058  NA 138  K 3.7  CL 103  CO2 28  BUN 9  CREATININE 0.63  CALCIUM 9.8  GLUCOSE 108*   No results found for: MG  Recent Labs  06/18/15 1505 06/18/15 1815 06/18/15 2120  TROPONINI <0.03 <0.03 <0.03    Recent Labs  06/18/15 1104  TROPIPOC 0.00   Lab Results  Component Value Date   DDIMER 0.51* 06/18/2015   No results found for: TSH, T4TOTAL, T3FREE, THYROIDAB  Echo: pending  ECG: 06/19/2015  Atrial fib, HR 82, no acute ischemic changes.  Radiology:  Dg Chest 2 View 06/18/2015   CLINICAL DATA:  Sternal chest pain and shortness of breath  EXAM: CHEST  2 VIEW  COMPARISON:  None.  FINDINGS: Normal heart size and mediastinal contours. No acute infiltrate or edema. No effusion or pneumothorax. No acute osseous findings.  IMPRESSION: No active cardiopulmonary disease.   Electronically Signed   By: Marnee Spring M.D.   On: 06/18/2015 10:59   Ct Angio Chest Pe W/cm &/or Wo Cm 06/18/2015   CLINICAL DATA:  Shortness of breath and chest pressure  EXAM: CT ANGIOGRAPHY CHEST WITH CONTRAST  TECHNIQUE: Multidetector CT imaging of the chest was performed using the standard protocol during bolus administration of intravenous contrast. Multiplanar CT image reconstructions and MIPs were obtained to evaluate the vascular anatomy.  CONTRAST:  OMNIPAQUE IOHEXOL 350 MG/ML SOLN  COMPARISON:  June 18, 2015 chest radiograph.  FINDINGS: There is no demonstrable pulmonary embolus. There is no thoracic aortic aneurysm or dissection. Visualized great vessels appear unremarkable.  There is a small bulla in the inferior aspect of the anterior segment right upper lobe. There is no edema or consolidation. The visualized thyroid appears normal. There is no appreciable thoracic adenopathy. The pericardium is not thickened.  Visualized upper abdominal structures appear unremarkable.  There are no blastic or lytic bone lesions.  Review of the MIP images confirms the above findings.  IMPRESSION: No demonstrable pulmonary embolus. No edema or consolidation. No appreciable thoracic adenopathy.   Electronically Signed   By: Chrissie Noa  Margarita Grizzle III M.D.   On: 06/18/2015 12:09    ASSESSMENT AND PLAN:   The patient was seen today by Dr Mayford Knife, the patient evaluated and the data reviewed.  Active Problems:   Chest pain - no hx exertional symptoms - ez neg MI - no ischemic changes on ECG - MD advise on stress testing and its timing. - Preliminary echo results, EF is 55-60% - will discuss with MD    Paroxysmal a-fib - duration unknown, feel > 36 hr - for now, rate control with BB +/- Cardizem - BP is borderline - may need to pursue SR    Anticoagulation -This patients CHA2DS2-VASc Score and unadjusted Ischemic Stroke Rate (% per year) is equal to 0.6 % stroke rate/year from a score of 1 Above score calculated as 1 point each if present [CHF, HTN, DM, Vascular=MI/PAD/Aortic Plaque, Age if 5-74, or Female], 2 points each if present [Age > 75, or Stroke/TIA/TE] - She is reluctant to be anticoagulated, but wishes to avoid CVA - For now Lovenox, d/c subcu heparin (no doses given yet) - would prefer a NOAC, will ask Case Mgr to look into this  SignedLeanna Battles 06/19/2015 8:36 AM Beeper [phone]  Co-Sign MD

## 2015-06-19 NOTE — Progress Notes (Signed)
Have ordered the Lexi MV and sent a staff message for the stress test and f/u appt.  She may also need an event monitor.  BarreLeanna Battles/12/2014 5:17 PM Beeper [phone]

## 2015-06-20 ENCOUNTER — Other Ambulatory Visit: Payer: Self-pay | Admitting: Cardiology

## 2015-06-20 DIAGNOSIS — E0590 Thyrotoxicosis, unspecified without thyrotoxic crisis or storm: Secondary | ICD-10-CM | POA: Diagnosis not present

## 2015-06-20 DIAGNOSIS — R079 Chest pain, unspecified: Secondary | ICD-10-CM | POA: Diagnosis not present

## 2015-06-20 DIAGNOSIS — I480 Paroxysmal atrial fibrillation: Secondary | ICD-10-CM | POA: Diagnosis not present

## 2015-06-20 DIAGNOSIS — R0789 Other chest pain: Secondary | ICD-10-CM | POA: Diagnosis not present

## 2015-06-20 LAB — HEPATIC FUNCTION PANEL
ALT: 79 U/L — ABNORMAL HIGH (ref 14–54)
AST: 54 U/L — AB (ref 15–41)
Albumin: 3.7 g/dL (ref 3.5–5.0)
Alkaline Phosphatase: 77 U/L (ref 38–126)
BILIRUBIN DIRECT: 0.1 mg/dL (ref 0.1–0.5)
Indirect Bilirubin: 0.6 mg/dL (ref 0.3–0.9)
Total Bilirubin: 0.7 mg/dL (ref 0.3–1.2)
Total Protein: 6.6 g/dL (ref 6.5–8.1)

## 2015-06-20 MED ORDER — DIGOXIN 0.25 MG/ML IJ SOLN
0.2500 mg | Freq: Once | INTRAMUSCULAR | Status: AC
  Administered 2015-06-20: 0.25 mg via INTRAVENOUS
  Filled 2015-06-20 (×2): qty 1

## 2015-06-20 MED ORDER — METHIMAZOLE 10 MG PO TABS
20.0000 mg | ORAL_TABLET | Freq: Every day | ORAL | Status: DC
  Administered 2015-06-20 – 2015-06-21 (×2): 20 mg via ORAL
  Filled 2015-06-20 (×2): qty 2

## 2015-06-20 MED ORDER — DIGOXIN 125 MCG PO TABS
0.2500 mg | ORAL_TABLET | Freq: Every day | ORAL | Status: DC
  Administered 2015-06-21: 0.25 mg via ORAL
  Filled 2015-06-20: qty 2

## 2015-06-20 NOTE — Progress Notes (Signed)
Subjective: Some rt side discomfort but feels like she slept wrong   Objective: Vital signs in last 24 hours: Temp:  [97.7 F (36.5 C)-97.8 F (36.6 C)] 97.8 F (36.6 C) (08/03 0617) Pulse Rate:  [86-92] 88 (08/03 0617) Resp:  [14-18] 18 (08/03 0617) BP: (96-122)/(51-72) 103/51 mmHg (08/03 0617) SpO2:  [98 %-99 %] 98 % (08/03 0617) Weight change:  Last BM Date: 06/18/15 Intake/Output from previous day: +480 08/02 0701 - 08/03 0700 In: 480 [P.O.:480] Out: -  Intake/Output this shift: Total I/O In: 240 [P.O.:240] Out: -   PE: General:Pleasant affect, NAD Skin:Warm and dry, brisk capillary refill HEENT:normocephalic, sclera clear, mucus membranes moist Heart:irreg irreg without murmur, gallup, rub or click Lungs:clear without rales, rhonchi, or wheezes ZOX:WRUE, non tender, + BS, do not palpate liver spleen or masses Ext:no lower ext edema, 2+ pedal pulses, 2+ radial pulses Neuro:alert and oriented X 3, MAE, follows commands, + facial symmetry Tele: a fib at times up to 160, occ PVCs    Lab Results:  Recent Labs  06/18/15 1058  WBC 4.6  HGB 13.6  HCT 40.8  PLT 197   BMET  Recent Labs  06/18/15 1058  NA 138  K 3.7  CL 103  CO2 28  GLUCOSE 108*  BUN 9  CREATININE 0.63  CALCIUM 9.8    Recent Labs  06/18/15 1815 06/18/15 2120  TROPONINI <0.03 <0.03    No results found for: CHOL, HDL, LDLCALC, LDLDIRECT, TRIG, CHOLHDL No results found for: AVWU9W   Lab Results  Component Value Date   TSH 0.013* 06/19/2015    Hepatic Function Panel  Recent Labs  06/20/15 0952  PROT 6.6  ALBUMIN 3.7  AST 54*  ALT 79*  ALKPHOS 77  BILITOT 0.7  BILIDIR 0.1  IBILI 0.6   No results for input(s): CHOL in the last 72 hours. No results for input(s): PROTIME in the last 72 hours.     Studies/Results: Ct Angio Chest Pe W/cm &/or Wo Cm  06/18/2015   CLINICAL DATA:  Shortness of breath and chest pressure  EXAM: CT ANGIOGRAPHY CHEST WITH CONTRAST   TECHNIQUE: Multidetector CT imaging of the chest was performed using the standard protocol during bolus administration of intravenous contrast. Multiplanar CT image reconstructions and MIPs were obtained to evaluate the vascular anatomy.  CONTRAST:  OMNIPAQUE IOHEXOL 350 MG/ML SOLN  COMPARISON:  June 18, 2015 chest radiograph.  FINDINGS: There is no demonstrable pulmonary embolus. There is no thoracic aortic aneurysm or dissection. Visualized great vessels appear unremarkable.  There is a small bulla in the inferior aspect of the anterior segment right upper lobe. There is no edema or consolidation. The visualized thyroid appears normal. There is no appreciable thoracic adenopathy. The pericardium is not thickened.  Visualized upper abdominal structures appear unremarkable.  There are no blastic or lytic bone lesions.  Review of the MIP images confirms the above findings.  IMPRESSION: No demonstrable pulmonary embolus. No edema or consolidation. No appreciable thoracic adenopathy.   Electronically Signed   By: Bretta Bang III M.D.   On: 06/18/2015 12:09    ECHO: Study Conclusions  - Left ventricle: The cavity size was normal. Wall thickness was normal. Systolic function was normal. The estimated ejection fraction was in the range of 55% to 60%.   Medications: I have reviewed the patient's current medications. Scheduled Meds: . methimazole  20 mg Oral Daily  . metoprolol tartrate  25 mg Oral  BID  . pantoprazole  40 mg Oral Daily  . rivaroxaban  20 mg Oral Q supper   Continuous Infusions:  PRN Meds:.acetaminophen, gi cocktail, nitroGLYCERIN, ondansetron (ZOFRAN) IV  Assessment/Plan: Active Problems:   Chest pain   Paroxysmal a-fib   Other chest pain   Hyperthyroidism  Chest pain - no hx exertional symptoms - ez neg MI - no ischemic changes on ECG - will plan outpt lexiscan- in several weeks?. - echo results, EF is 55-60% - will discuss with MD   Paroxysmal a-fib-  now persistent  - duration unknown, feel > 36 hr - for now, rate control with BB +/- Cardizem  HR up to 160 at times may be up more with more activity - BP is borderline - may need to pursue SR once her thyroid issues resolved  Dr. Mayford Knife to see to adjust meds.    Anticoagulation -This patients CHA2DS2-VASc Score and unadjusted Ischemic Stroke Rate (% per year) is equal to 0.6 % stroke rate/year from a score of 1 Above score calculated as 1 point each if present [CHF, HTN, DM, Vascular=MI/PAD/Aortic Plaque, Age if 48-74, or Female], 2 points each if present [Age > 75, or Stroke/TIA/TE] - She is reluctant to be anticoagulated, but wishes to avoid CVA - placed on Xarelto -   Hyperthyroid - TSH 0.013 now on tapazole    Time spent with pt. :15 minutes. [facility] R  Nurse Practitioner Certified Pager [phone] or after 5pm and on weekends call [phone] 06/20/2015, 11:13 AM

## 2015-06-20 NOTE — Progress Notes (Signed)
[facility] Progress Note   Mesa Janus ION:[id number] DOB: 1956-08-23 DOA: 06/18/2015 PCP: Cala Bradford, MD  Brief narrative: Julia Wilkerson is a 59 y.o. female given past medical history was admitted to the medicine service on 06/18/2015 when she presented with complaints of chest discomfort. Initial workup included an EKG that did not reveal acute ischemic changes however did reveal atrial fibrillation. A chest x-ray did not reveal acute cardiopulmonary disease. Patient was seen and evaluated by cardiology as newly diagnosed atrial fibrillation was treated with Lopressor 25 mg by mouth twice a day. A transthoracic echocardiogram performed 06/18/2015 showed normal EF of 55-60% without valvular abnormalities. TSH came back suppressed at 0.013, having a free T4 increase of 2.04. On further discussion patient had reported having a history of anxiety, tremors, and emotional lability. Case was discussed with Dr. [name] of endocrinology who recommended starting methimazole at 20 mg by mouth daily. He also recommended checking thyroid-stimulating immunoglobulin and thyroid peroxidase antibody. Follow-up appointment with endocrinology was set for August 10 at 4 PM.    Subjective: Patient becoming tearful during our discussion was morning. She currently denies further episodes of chest pain or shortness of breath.  Assessment/Plan: Active Problems:   Chest pain -She presents with chest pain having atypical features, may have been related to atrial fibrillation -Cardiology recommending an outpatient stress test. She has not had further episodes of chest pain as troponins have been negative 3 sets.    Atrial fibrillation -Suspect caused by hyperthyroid state -Continue metoprolol 25 mg by mouth twice a day -Cardiology recommending Xarelto and TEE with DC cardioversion on 8/5    Hyperthyroidism -She reports having symptoms consistent with hyperthyroidism including anxiety, emotional lability,  bilateral tremors, presenting with atrial fibrillation. -Possibilities include Hashimoto's thyroiditis, Graves' disease. I discussed case with Dr. [name] of endocrinology who recommended starting methimazole at 20 mg by mouth daily and checking thyroid-stimulating immunoglobulin and thyroid peroxidase antibodies. -Will continue beta blocker therapy for A. fib -Follow-up appointment with endocrinology set for 06/27/2015 at 4 PM   Appt with PCP: Requested Code Status: Full code Family Communication: Husband at bedside Disposition Plan: Hopefully home tomorrow DVT prophylaxis: Xarelto Consultants: Cardiology Procedures:  Antibiotics: Anti-infectives    None      Objective: Filed Weights   06/18/15 1500  Weight: 73.483 kg (162 lb)    Intake/Output Summary (Last 24 hours) at 06/20/15 0924 Last data filed at 06/19/15 1900  Gross per 24 hour  Intake    480 ml  Output      0 ml  Net    480 ml     Vitals Filed Vitals:   06/19/15 1234 06/19/15 1257 06/19/15 2058 06/20/15 0617  BP: 122/60 98/52 96/72  103/51  Pulse:  89 86 88  Temp:  97.8 F (36.6 C) 97.7 F (36.5 C) 97.8 F (36.6 C)  TempSrc:  Oral Oral Oral  Resp:  18 14 18   Height:      Weight:      SpO2:  99% 99% 98%    Exam:  General:  Patient is awake and alert, appears anxious, becoming tearful during my encounter.  HEENT: No icterus, No thrush, oral mucosa moist  Cardiovascular: irregular rate and rhythm, tachycardic, S1/S2 No murmur  Respiratory: clear to auscultation bilaterally   Abdomen: Soft, +Bowel sounds, non tender, non distended, no guarding  MSK: No LE edema, cyanosis or clubbing  Data Reviewed: Basic Metabolic Panel:  Recent Labs Lab 06/18/15 1058  NA 138  K 3.7  CL 103  CO2 28  GLUCOSE 108*  BUN 9  CREATININE 0.63  CALCIUM 9.8   Liver Function Tests: No results for input(s): AST, ALT, ALKPHOS, BILITOT, PROT, ALBUMIN in the last 168 hours. No results for input(s): LIPASE, AMYLASE  in the last 168 hours. No results for input(s): AMMONIA in the last 168 hours. CBC:  Recent Labs Lab 06/18/15 1058  WBC 4.6  HGB 13.6  HCT 40.8  MCV 89.3  PLT 197   Cardiac Enzymes:  Recent Labs Lab 06/18/15 1505 06/18/15 1815 06/18/15 2120  TROPONINI <0.03 <0.03 <0.03   BNP (last 3 results) No results for input(s): BNP in the last 8760 hours.  ProBNP (last 3 results) No results for input(s): PROBNP in the last 8760 hours.  CBG: No results for input(s): GLUCAP in the last 168 hours.  No results found for this or any previous visit (from the past 240 hour(s)).   Studies: Dg Chest 2 View  06/18/2015   CLINICAL DATA:  Sternal chest pain and shortness of breath  EXAM: CHEST  2 VIEW  COMPARISON:  None.  FINDINGS: Normal heart size and mediastinal contours. No acute infiltrate or edema. No effusion or pneumothorax. No acute osseous findings.  IMPRESSION: No active cardiopulmonary disease.   Electronically Signed   By: Marnee Spring M.D.   On: 06/18/2015 10:59   Ct Angio Chest Pe W/cm &/or Wo Cm  06/18/2015   CLINICAL DATA:  Shortness of breath and chest pressure  EXAM: CT ANGIOGRAPHY CHEST WITH CONTRAST  TECHNIQUE: Multidetector CT imaging of the chest was performed using the standard protocol during bolus administration of intravenous contrast. Multiplanar CT image reconstructions and MIPs were obtained to evaluate the vascular anatomy.  CONTRAST:  OMNIPAQUE IOHEXOL 350 MG/ML SOLN  COMPARISON:  June 18, 2015 chest radiograph.  FINDINGS: There is no demonstrable pulmonary embolus. There is no thoracic aortic aneurysm or dissection. Visualized great vessels appear unremarkable.  There is a small bulla in the inferior aspect of the anterior segment right upper lobe. There is no edema or consolidation. The visualized thyroid appears normal. There is no appreciable thoracic adenopathy. The pericardium is not thickened.  Visualized upper abdominal structures appear unremarkable.   There are no blastic or lytic bone lesions.  Review of the MIP images confirms the above findings.  IMPRESSION: No demonstrable pulmonary embolus. No edema or consolidation. No appreciable thoracic adenopathy.   Electronically Signed   By: Bretta Bang III M.D.   On: 06/18/2015 12:09    Scheduled Meds:  Scheduled Meds: . methimazole  20 mg Oral Daily  . metoprolol tartrate  25 mg Oral BID  . pantoprazole  40 mg Oral Daily  . rivaroxaban  20 mg Oral Q supper   Continuous Infusions:   Time spent on care of this patient: 35 min   Jeralyn Bennett, MD 06/20/2015, 9:24 AM    [facility] Office  [phone] Pager - Text Page per [email or web address] If 7PM-7AM, please contact night-coverage [email or web address]

## 2015-06-20 NOTE — Progress Notes (Signed)
D/w Dr. Mayford Knife. Patient originally scheduled for TEE/DCCV for 8/5, however, with new finding of hyperthyroidism, DCCV would likely prove futile until thyroid is under control. We have cancelled procedure and I made primary team aware. See cardiology team's rounding note for today. Crystian Frith PA-C

## 2015-06-21 ENCOUNTER — Other Ambulatory Visit: Payer: Self-pay | Admitting: Cardiology

## 2015-06-21 DIAGNOSIS — R000 Tachycardia, unspecified: Secondary | ICD-10-CM

## 2015-06-21 DIAGNOSIS — R0789 Other chest pain: Secondary | ICD-10-CM | POA: Diagnosis not present

## 2015-06-21 DIAGNOSIS — R079 Chest pain, unspecified: Secondary | ICD-10-CM | POA: Diagnosis not present

## 2015-06-21 DIAGNOSIS — I4819 Other persistent atrial fibrillation: Secondary | ICD-10-CM

## 2015-06-21 DIAGNOSIS — I480 Paroxysmal atrial fibrillation: Secondary | ICD-10-CM | POA: Diagnosis not present

## 2015-06-21 DIAGNOSIS — E0590 Thyrotoxicosis, unspecified without thyrotoxic crisis or storm: Secondary | ICD-10-CM | POA: Diagnosis not present

## 2015-06-21 LAB — CBC
HEMATOCRIT: 40.5 % (ref 36.0–46.0)
HEMOGLOBIN: 13.3 g/dL (ref 12.0–15.0)
MCH: 29.6 pg (ref 26.0–34.0)
MCHC: 32.8 g/dL (ref 30.0–36.0)
MCV: 90 fL (ref 78.0–100.0)
PLATELETS: 202 10*3/uL (ref 150–400)
RBC: 4.5 MIL/uL (ref 3.87–5.11)
RDW: 12.4 % (ref 11.5–15.5)
WBC: 8.2 10*3/uL (ref 4.0–10.5)

## 2015-06-21 LAB — THYROID PEROXIDASE ANTIBODY: Thyroperoxidase Ab SerPl-aCnc: <6 IU/mL (ref 0–34)

## 2015-06-21 MED ORDER — DIGOXIN 250 MCG PO TABS: 0.2500 mg | ORAL_TABLET | Freq: Every day | ORAL | Status: DC

## 2015-06-21 MED ORDER — METHIMAZOLE 10 MG PO TABS: 20.0000 mg | ORAL_TABLET | Freq: Every day | ORAL | Status: DC

## 2015-06-21 MED ORDER — METOPROLOL TARTRATE 25 MG PO TABS: 25.0000 mg | ORAL_TABLET | Freq: Two times a day (BID) | ORAL | Status: DC

## 2015-06-21 MED ORDER — RIVAROXABAN 20 MG PO TABS: 20.0000 mg | ORAL_TABLET | Freq: Every day | ORAL | Status: DC

## 2015-06-21 NOTE — Progress Notes (Signed)
Subjective: No complaints  Objective: Vital signs in last 24 hours: Temp:  [97.7 F (36.5 C)-98.1 F (36.7 C)] 98.1 F (36.7 C) (08/04 0616) Pulse Rate:  [80-96] 83 (08/04 0616) Resp:  [16-18] 18 (08/04 0616) BP: (101-124)/(54-63) 111/56 mmHg (08/04 0616) SpO2:  [98 %-100 %] 100 % (08/04 0616) Weight change:  Last BM Date: 06/20/15 Intake/Output from previous day: +960 08/03 0701 - 08/04 0700 In: 960 [P.O.:960] Out: -  Intake/Output this shift:    PE: General:Pleasant affect, NAD Skin:Warm and dry, brisk capillary refill HEENT:normocephalic, sclera clear, mucus membranes moist Heart:irreg irreg without murmur, gallup, rub or click Lungs:clear without rales, rhonchi, or wheezes ZOX:WRUE, non tender, + BS, do not palpate liver spleen or masses Ext:no lower ext edema, 2+ pedal pulses, 2+ radial pulses Neuro:alert and oriented, MAE, follows commands, + facial symmetry Tele:  A fib overall improved rate, though with waking today up to 120s    Lab Results:  Recent Labs  06/18/15 1058 06/21/15 0527  WBC 4.6 8.2  HGB 13.6 13.3  HCT 40.8 40.5  PLT 197 202   BMET  Recent Labs  06/18/15 1058  NA 138  K 3.7  CL 103  CO2 28  GLUCOSE 108*  BUN 9  CREATININE 0.63  CALCIUM 9.8    Recent Labs  06/18/15 1815 06/18/15 2120  TROPONINI <0.03 <0.03    No results found for: CHOL, HDL, LDLCALC, LDLDIRECT, TRIG, CHOLHDL No results found for: AVWU9W   Lab Results  Component Value Date   TSH 0.013* 06/19/2015    Hepatic Function Panel  Recent Labs  06/20/15 0952  PROT 6.6  ALBUMIN 3.7  AST 54*  ALT 79*  ALKPHOS 77  BILITOT 0.7  BILIDIR 0.1  IBILI 0.6   No results for input(s): CHOL in the last 72 hours. No results for input(s): PROTIME in the last 72 hours.     Studies/Results: No results found.  Medications: I have reviewed the patient's current medications. Scheduled Meds: . digoxin  0.25 mg Oral Daily  . methimazole  20 mg Oral  Daily  . metoprolol tartrate  25 mg Oral BID  . pantoprazole  40 mg Oral Daily  . rivaroxaban  20 mg Oral Q supper   Continuous Infusions:  PRN Meds:.acetaminophen, gi cocktail, nitroGLYCERIN, ondansetron (ZOFRAN) IV  Assessment/Plan: Active Problems:   Chest pain   Paroxysmal a-fib   Other chest pain   Hyperthyroidism   Chest pain - no hx exertional symptoms - ez neg MI - no ischemic changes on ECG - will plan outpt lexiscan- in several weeks?.-  - echo results, EF is 55-60%    Paroxysmal a-fib- now persistent  - duration unknown, feel > 36 hr prior to admit - for now, rate control with BB +/- Cardizem HR up to 160 at times may be up more with more activity- dig added with improved control  - BP is borderline, BP is some better. - may need to pursue SR once her thyroid issues resolved Dr. Mayford Knife to see     Anticoagulation -This patients CHA2DS2-VASc Score and unadjusted Ischemic Stroke Rate (% per year) is equal to 0.6 % stroke rate/year from a score of 1 Above score calculated as 1 point each if present [CHF, HTN, DM, Vascular=MI/PAD/Aortic Plaque, Age if 65-74, or Female], 2 points each if present [Age > 75, or Stroke/TIA/TE] - She is reluctant to be anticoagulated, but wishes to avoid CVA - placed on  Xarelto -   Hyperthyroid - TSH 0.013 now on tapazole    Time spent with pt. :15 minutes. [facility]  Nurse Practitioner Certified Pager [phone] or after 5pm and on weekends call [phone] 06/21/2015, 8:36 AM

## 2015-06-21 NOTE — Discharge Summary (Signed)
Physician Discharge Summary  Julia Wilkerson ZOX:[id number] DOB: Sep 15, 1956 DOA: 06/18/2015  PCP: Cala Bradford, MD  Admit date: 06/18/2015 Discharge date: 06/21/2015  Time spent: 35 minutes  Recommendations for Outpatient Follow-up:  1. Please follow up on heart rates, she was diagnosed with Afib during this hospitalization. Cardiology setting her up with monitor.  2. Follow up on repeat Thyroid Function Tests, she was diagnosed with Hyperthyroidism, discharged on Methimazole and set up to follow up with Dr [name] of Endocrinology.   Discharge Diagnoses:  Active Problems:   Chest pain   Paroxysmal a-fib   Other chest pain   Hyperthyroidism   Discharge Condition: Stable  Diet recommendation: Regular   Filed Weights   06/18/15 1500  Weight: 73.483 kg (162 lb)    History of present illness:  Julia Wilkerson is a 59 y.o. female with no significant past medical history that comes in for shortness of breath and chest pain that started the day of admission. She relates that he started with chest discomfort that has progressed to tightness. She relates nothing makes it better or worst. She relates some radiation to her neck but this has resolved. She denies any nausea, vomiting, diarrhea, change in her medication, no leg swelling, no recent traveling or surgeries. She is shortness of breath has improved she relates exertion did not make it worse and requested not make it better.      Hospital Course:  Julia Wilkerson is a 59 y.o. female given past medical history was admitted to the medicine service on 06/18/2015 when she presented with complaints of chest discomfort. Initial workup included an EKG that did not reveal acute ischemic changes however did reveal atrial fibrillation. A chest x-ray did not reveal acute cardiopulmonary disease. Patient was seen and evaluated by cardiology as newly diagnosed atrial fibrillation was treated with Lopressor 25 mg by mouth twice a day. A transthoracic  echocardiogram performed 06/18/2015 showed normal EF of 55-60% without valvular abnormalities. TSH came back suppressed at 0.013, having a free T4 increase of 2.04. On further discussion patient had reported having a history of anxiety, tremors, and emotional lability. Case was discussed with Dr. [name] of endocrinology who recommended starting methimazole at 20 mg by mouth daily. He also recommended checking thyroid-stimulating immunoglobulin and thyroid peroxidase antibody. Follow-up appointment with endocrinology was set for August 10 at 4 PM. With regard to Afib hearts improved with the addition of Digoxin to Metoprolol. Cardiology set her up with outpatient monitor.   Procedures:  Echo performed on 06/21/2015   Impression: Left ventricle: The cavity size was normal. Wall thickness was normal. Systolic function was normal. The estimated ejection fraction was in the range of 55% to 60%.   Consultations:  Cardiology  Discharge Exam: Filed Vitals:   06/21/15 1030  BP: 106/65  Pulse: 93  Temp:   Resp:      General: Patient is awake and alert, appears anxious, becoming tearful during my encounter.  HEENT: No icterus, No thrush, oral mucosa moist  Cardiovascular: irregular rate and rhythm, tachycardic, S1/S2 No murmur  Respiratory: clear to auscultation bilaterally   Abdomen: Soft, +Bowel sounds, non tender, non distended, no guarding  MSK: No LE edema, cyanosis or clubbing  Discharge Instructions   Discharge Instructions    Call MD for:  difficulty breathing, headache or visual disturbances    Complete by:  As directed      Call MD for:  extreme fatigue    Complete by:  As directed  Call MD for:  hives    Complete by:  As directed      Call MD for:  persistant dizziness or light-headedness    Complete by:  As directed      Call MD for:  persistant nausea and vomiting    Complete by:  As directed      Call MD for:  redness, tenderness, or signs of infection (pain,  swelling, redness, odor or green/yellow discharge around incision site)    Complete by:  As directed      Call MD for:  severe uncontrolled pain    Complete by:  As directed      Call MD for:  temperature >100.4    Complete by:  As directed      Call MD for:    Complete by:  As directed      Diet - low sodium heart healthy    Complete by:  As directed      Increase activity slowly    Complete by:  As directed           Current Discharge Medication List    START taking these medications   Details  digoxin (LANOXIN) 0.25 MG tablet Take 1 tablet (0.25 mg total) by mouth daily. Qty: 30 tablet, Refills: 1    methimazole (TAPAZOLE) 10 MG tablet Take 2 tablets (20 mg total) by mouth daily. Qty: 30 tablet, Refills: 1    metoprolol tartrate (LOPRESSOR) 25 MG tablet Take 1 tablet (25 mg total) by mouth 2 (two) times daily. Qty: 60 tablet, Refills: 1    rivaroxaban (XARELTO) 20 MG TABS tablet Take 1 tablet (20 mg total) by mouth daily with supper. Qty: 30 tablet, Refills: 1      CONTINUE these medications which have NOT CHANGED   Details  ibuprofen (ADVIL,MOTRIN) 200 MG tablet Take 800 mg by mouth every 4 (four) hours as needed for fever, headache, mild pain, moderate pain or cramping.       Allergies  Allergen Reactions  . Lamisil [Terbinafine Hcl] Other (See Comments)    Lost taste   . Penicillins Hives  . Septra [Sulfamethoxazole-Trimethoprim] Hives  . Azo [Phenazopyridine] Rash   Follow-up Information    Follow up with KERR,JEFFREY, MD. Go on 06/27/2015.   Specialty:  Endocrinology   Why:  Please arrive at Dr Daune Perch office at 4 pm   Contact information:   [address] [place] [address] [phone]       Follow up with Cala Bradford, MD In 1 week.   Specialty:  Family Medicine   Contact information:   [phone] [address] [place] [address] [phone]       Follow up with Quintella Reichert, MD On 07/10/2015.   Specialty:   Cardiology   Why:  at 10:30 AM with Wilburt Finlay, PA    Contact information:   [address] [place] [address] [phone]        The results of significant diagnostics from this hospitalization (including imaging, microbiology, ancillary and laboratory) are listed below for reference.    Significant Diagnostic Studies: Dg Chest 2 View  06/18/2015   CLINICAL DATA:  Sternal chest pain and shortness of breath  EXAM: CHEST  2 VIEW  COMPARISON:  None.  FINDINGS: Normal heart size and mediastinal contours. No acute infiltrate or edema. No effusion or pneumothorax. No acute osseous findings.  IMPRESSION: No active cardiopulmonary disease.   Electronically Signed  By: Marnee Spring M.D.   On: 06/18/2015 10:59   Ct Angio Chest Pe W/cm &/or Wo Cm  06/18/2015   CLINICAL DATA:  Shortness of breath and chest pressure  EXAM: CT ANGIOGRAPHY CHEST WITH CONTRAST  TECHNIQUE: Multidetector CT imaging of the chest was performed using the standard protocol during bolus administration of intravenous contrast. Multiplanar CT image reconstructions and MIPs were obtained to evaluate the vascular anatomy.  CONTRAST:  OMNIPAQUE IOHEXOL 350 MG/ML SOLN  COMPARISON:  June 18, 2015 chest radiograph.  FINDINGS: There is no demonstrable pulmonary embolus. There is no thoracic aortic aneurysm or dissection. Visualized great vessels appear unremarkable.  There is a small bulla in the inferior aspect of the anterior segment right upper lobe. There is no edema or consolidation. The visualized thyroid appears normal. There is no appreciable thoracic adenopathy. The pericardium is not thickened.  Visualized upper abdominal structures appear unremarkable.  There are no blastic or lytic bone lesions.  Review of the MIP images confirms the above findings.  IMPRESSION: No demonstrable pulmonary embolus. No edema or consolidation. No appreciable thoracic adenopathy.   Electronically Signed   By: Bretta Bang  III M.D.   On: 06/18/2015 12:09    Microbiology: No results found for this or any previous visit (from the past 240 hour(s)).   Labs: Basic Metabolic Panel:  Recent Labs Lab 06/18/15 1058  NA 138  K 3.7  CL 103  CO2 28  GLUCOSE 108*  BUN 9  CREATININE 0.63  CALCIUM 9.8   Liver Function Tests:  Recent Labs Lab 06/20/15 0952  AST 54*  ALT 79*  ALKPHOS 77  BILITOT 0.7  PROT 6.6  ALBUMIN 3.7   No results for input(s): LIPASE, AMYLASE in the last 168 hours. No results for input(s): AMMONIA in the last 168 hours. CBC:  Recent Labs Lab 06/18/15 1058 06/21/15 0527  WBC 4.6 8.2  HGB 13.6 13.3  HCT 40.8 40.5  MCV 89.3 90.0  PLT 197 202   Cardiac Enzymes:  Recent Labs Lab 06/18/15 1505 06/18/15 1815 06/18/15 2120  TROPONINI <0.03 <0.03 <0.03   BNP: BNP (last 3 results) No results for input(s): BNP in the last 8760 hours.  ProBNP (last 3 results) No results for input(s): PROBNP in the last 8760 hours.  CBG: No results for input(s): GLUCAP in the last 168 hours.     SignedJeralyn Bennett  [facility] 06/21/2015, 10:41 AM

## 2015-06-21 NOTE — Progress Notes (Signed)
Patient reviewed discharge paperwork. Patient understood d/c instructions and follow up appointments. Patient IV removed. No signs of infection noted.

## 2015-06-22 ENCOUNTER — Telehealth: Payer: Self-pay | Admitting: Physician Assistant

## 2015-06-22 ENCOUNTER — Ambulatory Visit (HOSPITAL_COMMUNITY)
Admission: RE | Admit: 2015-06-22 | Payer: Commercial Indemnity | Source: Ambulatory Visit | Admitting: Cardiovascular Disease

## 2015-06-22 ENCOUNTER — Encounter (HOSPITAL_COMMUNITY): Admission: RE | Payer: Self-pay | Source: Ambulatory Visit

## 2015-06-22 ENCOUNTER — Ambulatory Visit (INDEPENDENT_AMBULATORY_CARE_PROVIDER_SITE_OTHER): Payer: Commercial Indemnity

## 2015-06-22 DIAGNOSIS — R000 Tachycardia, unspecified: Secondary | ICD-10-CM | POA: Diagnosis not present

## 2015-06-22 DIAGNOSIS — I4819 Other persistent atrial fibrillation: Secondary | ICD-10-CM

## 2015-06-22 DIAGNOSIS — I481 Persistent atrial fibrillation: Secondary | ICD-10-CM | POA: Diagnosis not present

## 2015-06-22 SURGERY — ECHOCARDIOGRAM, TRANSESOPHAGEAL
Anesthesia: Monitor Anesthesia Care

## 2015-06-22 NOTE — Telephone Encounter (Signed)
New message     Pt was discharged from hospital yesterday Pt b/p is running low and she has questions whether to take medication due to low b/p Please call to discuss

## 2015-06-22 NOTE — Telephone Encounter (Signed)
Reviewed medications with pt.  She states she was instructed to hold Digoxin if HR is less than 60.  It has not been and she is taking medication as directed.  Her BP now is low at 74/56.  She reports her normal is around 90/60.  She did take her Lopressor this am.  Advised pt to increase fluid intake - atleast 2 liters everyday and monitor her BP.  If it continue to be low she was advised to hold pm dose of Lopressor.  She is scheduled today for a heart monitor placement at 12:30.  She is aware to call back if BP continues to be low.

## 2015-06-23 LAB — THYROID STIMULATING IMMUNOGLOBULIN: THYROID STIMULATING IMMUNOGLOB: 351 % — AB (ref 0–139)

## 2015-06-26 ENCOUNTER — Telehealth: Payer: Self-pay

## 2015-06-26 NOTE — Telephone Encounter (Signed)
Lupita Leash, Monitor Tech from [place], called to inform that patient's holter recorded 2 V-tach/Ventricular runs, A-tach, and PVC's that she wants a doctor to review. She sent the results to [facility] and [place]. Will retrieve monitor strips for DOD to review.  Called patient and she turned monitor in Monday.  She reported feeling tired and like her heart was "thumping" a couple times while wearing the monitor. Today, BP= 99/50 and HR 64

## 2015-06-26 NOTE — Telephone Encounter (Signed)
Showed DOD Dr Katrinka Blazing the pts 48 hr holter monitor results and per Dr Katrinka Blazing the pt needs an OV ASAP, and no change in therapy.  Scheduled the pt on the Flex schedule for tomorrow 8/10 at [facility] location.  Informed the pt of this appt and to come 15 mins early.  Informed the pt that if she were to have any cardiac issues with cp, sob, DOE, palpitations, dizziness, pre-syncopal or syncopal episodes over the course of the day and night, she should call our on-call MD to inform, or seek immediate medical attention.  Pt verbalized understanding and agrees with this plan.

## 2015-06-27 ENCOUNTER — Ambulatory Visit (INDEPENDENT_AMBULATORY_CARE_PROVIDER_SITE_OTHER): Payer: Commercial Indemnity | Admitting: Physician Assistant

## 2015-06-27 ENCOUNTER — Encounter: Payer: Self-pay | Admitting: Physician Assistant

## 2015-06-27 VITALS — BP 102/60 | HR 69 | Ht 68.0 in | Wt 157.0 lb

## 2015-06-27 DIAGNOSIS — I480 Paroxysmal atrial fibrillation: Secondary | ICD-10-CM | POA: Diagnosis not present

## 2015-06-27 MED ORDER — METOPROLOL TARTRATE 25 MG PO TABS
12.5000 mg | ORAL_TABLET | Freq: Two times a day (BID) | ORAL | Status: DC
Start: 2015-06-27 — End: 2015-10-10

## 2015-06-27 NOTE — Patient Instructions (Addendum)
Medication Instructions:  Your physician has recommended you make the following change in your medication:  REDUCE Metoprolol to 12.5mg  (1/2 tablet) Twice Daily  Labwork: None   Testing/Procedures: None   Follow-Up: Your physician recommends that you schedule a follow-up appointment in: 1-2 months with Dr.Cooper   Any Other Special Instructions Will Be Listed Below (If Applicable).

## 2015-06-27 NOTE — Progress Notes (Signed)
Patient ID: Julia Wilkerson, female   DOB: 1956-05-04, 59 y.o.   MRN: [id number]    Date:  06/27/2015   ID:  Julia Wilkerson, DOB 25-Oct-1956, MRN [id number]  PCP:  Cala Bradford, MD   Primary Cardiologist:  Excell Seltzer- Patient requested since he cares for her mother.    No chief complaint on file.    History of Present Illness: Julia Wilkerson is a 59 y.o. female with a history of hyperlipidemia. She was recently admitted earlier in the month chest tightness and paroxysmal atrial fibrillation. She was started on Xarelto, digoxin, metoprolol.  She was found to have a low TSH, elevated free T4.  She ruled out for MI.    She presents today for posthospital evaluation. She wore a Holter monitor for couple days which revealed an SVT, PACs, PVCs.  Her EKG today shows normal sinus rhythm rate of 62 bpm she describes feelings of chest tightness leg swelling shortness of breath with activity excessive fatigue and irregular heartbeats and mild dizziness.  She denies nausea, vomiting, fever, orthopnea,  PND, cough, congestion, abdominal pain, hematochezia, melena, lower extremity edema, claudication.  She had a record of her blood pressure at home and she had several that were below 100/60. She also had mono was 86/49.  Wt Readings from Last 3 Encounters:  06/27/15 157 lb (71.215 kg)  06/18/15 162 lb (73.483 kg)     Past Medical History  Diagnosis Date  . High cholesterol     Current Outpatient Prescriptions  Medication Sig Dispense Refill  . acetaminophen (TYLENOL) 500 MG tablet Take 500 mg by mouth every 6 (six) hours as needed for mild pain, fever or headache.    . digoxin (LANOXIN) 0.25 MG tablet Take 1 tablet (0.25 mg total) by mouth daily. 30 tablet 1  . methimazole (TAPAZOLE) 10 MG tablet Take 2 tablets (20 mg total) by mouth daily. 30 tablet 1  . metoprolol tartrate (LOPRESSOR) 25 MG tablet Take 1 tablet (25 mg total) by mouth 2 (two) times daily. 60 tablet 1  . rivaroxaban (XARELTO) 20 MG TABS  tablet Take 1 tablet (20 mg total) by mouth daily with supper. 30 tablet 1   No current facility-administered medications for this visit.    Allergies:    Allergies  Allergen Reactions  . Lamisil [Terbinafine Hcl] Other (See Comments)    REACTION: Lost taste   . Azo [Phenazopyridine] Hives and Rash  . Penicillins Hives  . Septra [Sulfamethoxazole-Trimethoprim] Hives    Social History:  The patient  reports that she has never smoked. She has never used smokeless tobacco. She reports that she does not drink alcohol.   Family history:   Family History  Problem Relation Age of Onset  . Glaucoma Mother   . Heart attack Mother     MI in her 11s  . Osteopenia Mother   . Hypothyroidism Mother     ROS:  Please see the history of present illness.  All other systems reviewed and negative.   PHYSICAL EXAM: VS:  BP 102/60 mmHg  Pulse 69  Ht  (1.727 m)  Wt 157 lb (71.215 kg)  BMI 23.88 kg/m2  SpO2 98% Well nourished, well developed, in no acute distress HEENT: Pupils are equal round react to light accommodation extraocular movements are intact.  Neck: no JVDNo cervical lymphadenopathy. Cardiac: Regular rate and rhythm without murmurs rubs or gallops. Lungs:  clear to auscultation bilaterally, no wheezing, rhonchi or rales Ext: no lower extremity edema.  2+ radial  and dorsalis pedis pulses. Skin: warm and dry Neuro:  Grossly normal  EKG:     Sinus rhythm rate 62 bpm  ASSESSMENT AND PLAN:  Paroxysmal atrial fibrillation Patient is currently maintaining normal sinus rhythm rate of 62 bpm. She is on digoxin and Lopressor. She has had some hypotension so I'll decrease the Lopressor to 12.5 mg twice daily. Once her thyroid labs have stabilized we'll schedule her for a stress test. Orders already written.  She is due to see her endocrinologist this afternoon.  She is on Xarelto  Non-sustained ventricular tachycardia Continue lower dose of beta blocker  Hyperthyroidism  She is  on methimazole.  Per endocrinology

## 2015-07-10 ENCOUNTER — Encounter: Payer: Commercial Indemnity | Admitting: Physician Assistant

## 2015-08-17 ENCOUNTER — Other Ambulatory Visit: Payer: Self-pay

## 2015-08-17 MED ORDER — RIVAROXABAN 20 MG PO TABS: 20.0000 mg | ORAL_TABLET | Freq: Every day | ORAL | Status: DC

## 2015-08-17 MED ORDER — DIGOXIN 250 MCG PO TABS: 0.2500 mg | ORAL_TABLET | Freq: Every day | ORAL | Status: DC

## 2015-08-23 ENCOUNTER — Encounter: Payer: Self-pay | Admitting: Cardiology

## 2015-08-24 ENCOUNTER — Encounter: Payer: Self-pay | Admitting: Cardiology

## 2015-08-28 ENCOUNTER — Encounter: Payer: Self-pay | Admitting: Cardiology

## 2015-08-28 ENCOUNTER — Ambulatory Visit (INDEPENDENT_AMBULATORY_CARE_PROVIDER_SITE_OTHER): Payer: Commercial Indemnity | Admitting: Cardiology

## 2015-08-28 ENCOUNTER — Telehealth: Payer: Self-pay | Admitting: Cardiology

## 2015-08-28 VITALS — BP 102/62 | HR 56 | Ht 68.0 in | Wt 160.4 lb

## 2015-08-28 DIAGNOSIS — R079 Chest pain, unspecified: Secondary | ICD-10-CM | POA: Diagnosis not present

## 2015-08-28 DIAGNOSIS — E0590 Thyrotoxicosis, unspecified without thyrotoxic crisis or storm: Secondary | ICD-10-CM | POA: Diagnosis not present

## 2015-08-28 DIAGNOSIS — I480 Paroxysmal atrial fibrillation: Secondary | ICD-10-CM | POA: Diagnosis not present

## 2015-08-28 NOTE — Telephone Encounter (Signed)
New problem   Pt forgot to ask doctor if she can have 1 to 2 cups of coffee a day. Please advise.

## 2015-08-28 NOTE — Patient Instructions (Signed)
Medication Instructions:  Your physician has recommended you make the following change in your medication: 1) STOP DIGOXIN  Labwork: None  Testing/Procedures: None  Follow-Up: Your physician recommends that you schedule a follow-up appointment in: 3 months with Dr. Mayford Knife.  Any Other Special Instructions Will Be Listed Below (If Applicable).

## 2015-08-28 NOTE — Telephone Encounter (Signed)
Instructed patient to avoid caffeine given history of SVT and PAF.  Patient agrees with treatment plan.

## 2015-08-28 NOTE — Telephone Encounter (Signed)
I would prefer that she avoid caffiene with history of SVT and PAF

## 2015-08-28 NOTE — Progress Notes (Signed)
Cardiology Office Note   Date:  08/28/2015   ID:  Julia Wilkerson, DOB [birth year], MRN [id number]  PCP:  Cala Bradford, MD    Chief Complaint  Patient presents with  . Atrial Fibrillation      History of Present Illness: Julia Wilkerson is a 59 y.o. female with a history of hyperlipidemia. She has a history of paroxysmal atrial fibrillation and is on Xarelto, digoxin, metoprolol. She was found to have a low TSH, elevated free T4.She presents today for followup. She wore a Holter monitor for couple days which revealed an SVT, PACs, PVCs.    Past Medical History  Diagnosis Date  . High cholesterol     Past Surgical History  Procedure Laterality Date  . None       Current Outpatient Prescriptions  Medication Sig Dispense Refill  . acetaminophen (TYLENOL) 500 MG tablet Take 500 mg by mouth every 6 (six) hours as needed for mild pain, fever or headache.    . digoxin (LANOXIN) 0.25 MG tablet Take 1 tablet (0.25 mg total) by mouth daily. 30 tablet 5  . methimazole (TAPAZOLE) 10 MG tablet Take three (3) tablets (30 mg total) by mouth daily.    . metoprolol tartrate (LOPRESSOR) 25 MG tablet Take 0.5 tablets (12.5 mg total) by mouth 2 (two) times daily.    . rivaroxaban (XARELTO) 20 MG TABS tablet Take 1 tablet (20 mg total) by mouth daily with supper. 30 tablet 5   No current facility-administered medications for this visit.    Allergies:   Lamisil; Azo; Penicillins; and Septra    Social History:  The patient  reports that she has never smoked. She has never used smokeless tobacco. She reports that she does not drink alcohol.   Family History:  The patient's family history includes Glaucoma in her mother; Heart attack in her mother; Hypothyroidism in her mother; Osteopenia in her mother.    ROS:  Please see the history of present illness.   Otherwise, review of systems are positive for none.   All other systems are reviewed and negative.    PHYSICAL  EXAM: VS:  BP 102/62 mmHg  Pulse 56  Ht  (1.727 m)  Wt 160 lb 6.4 oz (72.757 kg)  BMI 24.39 kg/m2  SpO2 98% , BMI Body mass index is 24.39 kg/(m^2). GEN: Well nourished, well developed, in no acute distress HEENT: normal Neck: no JVD, carotid bruits, or masses Cardiac: RRR; no murmurs, rubs, or gallops,no edema  Respiratory:  clear to auscultation bilaterally, normal work of breathing GI: soft, nontender, nondistended, + BS MS: no deformity or atrophy Skin: warm and dry, no rash Neuro:  Strength and sensation are intact Psych: euthymic mood, full affect   EKG:  EKG is not ordered today.    Recent Labs: 06/18/2015: BUN 9; Creatinine, Ser 0.63; Potassium 3.7; Sodium 138 06/19/2015: TSH 0.013* 06/20/2015: ALT 79* 06/21/2015: Hemoglobin 13.3; Platelets 202    Lipid Panel No results found for: CHOL, TRIG, HDL, CHOLHDL, VLDL, LDLCALC, LDLDIRECT    Wt Readings from Last 3 Encounters:  08/28/15 160 lb 6.4 oz (72.757 kg)  06/27/15 157 lb (71.215 kg)  06/18/15 162 lb (73.483 kg)     ASSESSMENT AND PLAN:  Paroxysmal atrial fibrillation Patient is currently maintaining normal sinus rhythm rate of 62 bpm. She is on digoxin and Lopressor.  Once her thyroid labs have stabilized we'll schedule her  for a stress test.This was scheduled last OV due to some CP but we are waiting on euthyroid state first. She has not had any further CP.  She is on Xarelto.  Her CHADS2VASC score is 1 so once she is maintaining euthyroid state we can stop her Xarelto since she is maintaining NSR. She is bradycardic and maintaining sinus so I have told her to stop her digoxin.   I will see her back in 3 months and reassess.  Non-sustained ventricular tachycardia Continue lower dose of beta blocker  Hyperthyroidism She is on methimazole. Per endocrinology    Current medicines are reviewed at length with the patient today.  The patient does not have concerns regarding medicines.  The following  changes have been made:  no change  Labs/ tests ordered today: See above Assessment and Plan No orders of the defined types were placed in this encounter.     Disposition:   FU with me in 3 months  Signed, Quintella Reichert, MD  08/28/2015 9:46 AM    [facility] [address] [place], [place], [place]  [address] Phone: [phone]; Fax: [phone]

## 2015-08-29 ENCOUNTER — Telehealth: Payer: Self-pay

## 2015-08-29 NOTE — Telephone Encounter (Signed)
-----   Message from Quintella Reichertraci R Turner, MD sent at 08/26/2015 11:26 PM EDT ----- Please find out if patient's PCP is treating her elevated LDL and if they have rechecked a TSH recently

## 2015-08-29 NOTE — Telephone Encounter (Signed)
Spoke to Katrina at Dr. Lucilla LameWhite's office.  She is to ask Dr. Cliffton AstersWhite if she is treating patient's elevated LDL and call back.

## 2015-08-30 NOTE — Telephone Encounter (Signed)
Labs received for Dr. Mayford Knifeurner to review.

## 2015-08-30 NOTE — Telephone Encounter (Signed)
Julia Wilkerson from [facility] responding to previous note  Pt is declining treatment, Dr Cliffton AstersWhite recommending start back lipitor, 20 mg daily, pt sdoes not want to take it. If DR turner can encourage pt to take, it would be great- when pt on Lipitor her LDL was 80.  Any other questions can call office back

## 2015-08-30 NOTE — Telephone Encounter (Signed)
To Dr. Turner for review. 

## 2015-08-30 NOTE — Telephone Encounter (Signed)
Please get a copy of last lipids and TSH

## 2015-08-31 NOTE — Telephone Encounter (Signed)
Called patient to encourage her to restart lipitor.  Patient st Dr. Lucilla LameWhite's office called yesterday and convinced her to restart the medication.  They called her in an Rx and will follow. As far as TSH, patient st Dr. Sharl MaKerr treats that and she has a FU appointment next week.  Med list updated.

## 2015-09-07 ENCOUNTER — Encounter: Payer: Self-pay | Admitting: Cardiology

## 2015-09-10 ENCOUNTER — Telehealth: Payer: Self-pay

## 2015-09-10 DIAGNOSIS — I480 Paroxysmal atrial fibrillation: Secondary | ICD-10-CM

## 2015-09-10 NOTE — Telephone Encounter (Signed)
-----   Message from Quintella Reichertraci R Turner, MD sent at 09/09/2015 10:04 PM EDT ----- Please have patient come in for free T4 and T3 and TSH

## 2015-09-10 NOTE — Telephone Encounter (Signed)
Scheduled lab appointment for this Wednesday. Patient agrees with treatment plan.

## 2015-09-12 ENCOUNTER — Other Ambulatory Visit (INDEPENDENT_AMBULATORY_CARE_PROVIDER_SITE_OTHER): Payer: Commercial Indemnity | Admitting: *Deleted

## 2015-09-12 DIAGNOSIS — I480 Paroxysmal atrial fibrillation: Secondary | ICD-10-CM | POA: Diagnosis not present

## 2015-09-13 LAB — T4, FREE: Free T4: 0.4 ng/dL — ABNORMAL LOW (ref 0.80–1.80)

## 2015-09-13 LAB — T3, FREE: T3 FREE: 1.9 pg/mL — AB (ref 2.3–4.2)

## 2015-09-13 LAB — TSH: TSH: 53.262 u[IU]/mL — AB (ref 0.350–4.500)

## 2015-09-17 ENCOUNTER — Telehealth: Payer: Self-pay | Admitting: Cardiology

## 2015-09-17 NOTE — Telephone Encounter (Signed)
Informed patient of results and verbal understanding expressed.  Patient is seen by Dr. Sharl MaKerr. He saw her 1 week after Dr. Mayford Knifeurner and he decreased methimazole to 10 mg daily. Will request Dr. Daune PerchKerr's OV note. Labs routed to Dr. Sharl MaKerr. Med list updated.

## 2015-09-17 NOTE — Telephone Encounter (Signed)
Follow Up ° °Pt returning call from earlier. Please call. °

## 2015-09-17 NOTE — Telephone Encounter (Signed)
-----   Message from Quintella Reichertraci R Turner, MD sent at 09/16/2015 12:47 PM EDT ----- Now appears markedly hypothyroid.  Please find out if patient underwent radioactive iodine treatment for her hyperthyroidism or what treatment her endocrinologist is doing at this time. Please get a copy of notes from Endocrine and forward these labs to them

## 2015-09-18 NOTE — Telephone Encounter (Signed)
Left message to call back to request medical records.

## 2015-10-01 NOTE — Telephone Encounter (Signed)
Message sent to Medical Records to obtain Dr. Daune PerchKerr's OV note.

## 2015-10-04 NOTE — Telephone Encounter (Signed)
OV note received and given to Dr. Mayford Knifeurner for review.

## 2015-10-10 ENCOUNTER — Telehealth: Payer: Self-pay | Admitting: Cardiology

## 2015-10-10 MED ORDER — METOPROLOL TARTRATE 25 MG PO TABS: 12.5000 mg | ORAL_TABLET | Freq: Two times a day (BID) | ORAL | Status: DC

## 2015-10-10 NOTE — Telephone Encounter (Signed)
Pt's Rx was sent to pt's pharmacy as requested. Confirmation received.  °

## 2015-11-28 ENCOUNTER — Ambulatory Visit (INDEPENDENT_AMBULATORY_CARE_PROVIDER_SITE_OTHER): Payer: Managed Care, Other (non HMO) | Admitting: Cardiology

## 2015-11-28 ENCOUNTER — Encounter: Payer: Self-pay | Admitting: Cardiology

## 2015-11-28 VITALS — BP 102/58 | HR 76 | Ht 68.0 in | Wt 163.0 lb

## 2015-11-28 DIAGNOSIS — R079 Chest pain, unspecified: Secondary | ICD-10-CM | POA: Diagnosis not present

## 2015-11-28 DIAGNOSIS — I493 Ventricular premature depolarization: Secondary | ICD-10-CM

## 2015-11-28 DIAGNOSIS — I480 Paroxysmal atrial fibrillation: Secondary | ICD-10-CM

## 2015-11-28 DIAGNOSIS — E0590 Thyrotoxicosis, unspecified without thyrotoxic crisis or storm: Secondary | ICD-10-CM | POA: Diagnosis not present

## 2015-11-28 HISTORY — DX: Ventricular premature depolarization: I49.3

## 2015-11-28 NOTE — Patient Instructions (Signed)
Medication Instructions:  Your physician recommends that you continue on your current medications as directed. Please refer to the Current Medication list given to you today.   Labwork: None  Testing/Procedures: Dr. Turner recommends you have a NUCLEAR STRESS TEST.  Follow-Up: Your physician wants you to follow-up in: 6 months with Dr. Turner. You will receive a reminder letter in the mail two months in advance. If you don't receive a letter, please call our office to schedule the follow-up appointment.   Any Other Special Instructions Will Be Listed Below (If Applicable).     If you need a refill on your cardiac medications before your next appointment, please call your pharmacy.   

## 2015-11-28 NOTE — Progress Notes (Signed)
Cardiology Office Note   Date:  11/28/2015   ID:  Julia Wilkerson, DOB 05/29/1956, MRN [id number]  PCP:  Cala BradfordWHITE,CYNTHIA S, MD    Chief Complaint  Patient presents with  . Atrial Fibrillation      History of Present Illness: Julia PyleGeraldine Dorrough is a 60 y.o. female with a history of hyperlipidemia. She has a history of paroxysmal atrial fibrillation and is on Xarelto, digoxin, metoprolol.She also has a history of SVT, PACs, PVCs.She had not had any chest pain until this past weekend and starting having aching in her chest that occur a few times lasting 10-15 minutes at a time and occurred while lying down.  She still has intermittent SOB that is not exacerbated by exertion.      Past Medical History  Diagnosis Date  . High cholesterol   . PVC (premature ventricular contraction) 11/28/2015    Past Surgical History  Procedure Laterality Date  . None       Current Outpatient Prescriptions  Medication Sig Dispense Refill  . acetaminophen (TYLENOL) 500 MG tablet Take 500 mg by mouth every 6 (six) hours as needed for mild pain, fever or headache.    Marland Kitchen. atorvastatin (LIPITOR) 20 MG tablet Take 20 mg by mouth daily.    . methimazole (TAPAZOLE) 10 MG tablet 20 mg daily.     . metoprolol tartrate (LOPRESSOR) 25 MG tablet Take 0.5 tablets (12.5 mg total) by mouth 2 (two) times daily. 30 tablet 11  . rivaroxaban (XARELTO) 20 MG TABS tablet Take 1 tablet (20 mg total) by mouth daily with supper. 30 tablet 5   No current facility-administered medications for this visit.    Allergies:   Lamisil; Azo; Penicillins; and Septra    Social History:  The patient  reports that she has never smoked. She has never used smokeless tobacco. She reports that she does not drink alcohol.   Family History:  The patient's family history includes Glaucoma in her mother; Heart attack in her mother; Hypothyroidism in her mother; Osteopenia in her mother.    ROS:  Please see the history of  present illness.   Otherwise, review of systems are positive for none.   All other systems are reviewed and negative.    PHYSICAL EXAM: VS:  BP 102/58 mmHg  Pulse 76  Ht 5\' 8"  (1.727 m)  Wt 163 lb (73.936 kg)  BMI 24.79 kg/m2 , BMI Body mass index is 24.79 kg/(m^2). GEN: Well nourished, well developed, in no acute distress HEENT: normal Neck: no JVD, carotid bruits, or masses Cardiac: RRR; no murmurs, rubs, or gallops,no edema  Respiratory:  clear to auscultation bilaterally, normal work of breathing GI: soft, nontender, nondistended, + BS MS: no deformity or atrophy Skin: warm and dry, no rash Neuro:  Strength and sensation are intact Psych: euthymic mood, full affect   EKG:  EKG is not ordered today.    Recent Labs: 06/18/2015: BUN 9; Creatinine, Ser 0.63; Potassium 3.7; Sodium 138 06/20/2015: ALT 79* 06/21/2015: Hemoglobin 13.3; Platelets 202 09/12/2015: TSH 53.262*    Lipid Panel No results found for: CHOL, TRIG, HDL, CHOLHDL, VLDL, LDLCALC, LDLDIRECT    Wt Readings from Last 3 Encounters:  11/28/15 163 lb (73.936 kg)  08/28/15 160 lb 6.4 oz (72.757 kg)  06/27/15 157 lb (71.215 kg)     ASSESSMENT AND PLAN:  1.  Paroxysmal atrial fibrillatio: maintaining normal sinus rhythm. Continue digoxin  and  Lopressor. Continue Xarelto.  Her CHADS2VASC score is 1 so once she is maintaining euthyroid state we can stop her Xarelto since she is maintaining NSR. She is bradycardic and maintaining sinus so I have told her to stop her digoxin. I will see her back in 3 months and reassess.  2.  Chest pain - still awaiting for her thyroid labs to stabilize and we'll schedule her for a stress test.She has not had any further CP.   3.  Non-sustained ventricular tachycardia - Continue lower dose of beta blocker  4. Hyperthyroidism - She is on methimazole. Per endocrinology  5.  PVCs - suppressed on BB    Current medicines are reviewed at length with the patient today.  The patient  does not have concerns regarding medicines.  The following changes have been made:  no change  Labs/ tests ordered today: See above Assessment and Plan No orders of the defined types were placed in this encounter.     Disposition:   FU with me in 6 months  Signed, Quintella Reichert, MD  11/28/2015 8:37 AM    [facility] [address] [place], [place], [place]  [address] Phone: [phone]; Fax: [phone]

## 2015-11-29 ENCOUNTER — Telehealth (HOSPITAL_COMMUNITY): Payer: Self-pay | Admitting: *Deleted

## 2015-11-29 NOTE — Telephone Encounter (Signed)
Patient given detailed instructions per Myocardial Perfusion Study Information Sheet for the test on 12/04/15 at 730. Patient notified to arrive 15 minutes early and that it is imperative to arrive on time for appointment to keep from having the test rescheduled.  If you need to cancel or reschedule your appointment, please call the office within 24 hours of your appointment. Failure to do so may result in a cancellation of your appointment, and a $50 no show fee. Patient verbalized understanding.Kaleab Frasier J Shandricka Monroy, RN  

## 2015-12-04 ENCOUNTER — Ambulatory Visit (HOSPITAL_COMMUNITY): Payer: Managed Care, Other (non HMO) | Attending: Cardiology

## 2015-12-04 DIAGNOSIS — R0602 Shortness of breath: Secondary | ICD-10-CM | POA: Insufficient documentation

## 2015-12-04 DIAGNOSIS — R079 Chest pain, unspecified: Secondary | ICD-10-CM | POA: Insufficient documentation

## 2015-12-04 LAB — MYOCARDIAL PERFUSION IMAGING
CHL CUP NUCLEAR SDS: 0
CHL CUP NUCLEAR SRS: 7
CHL CUP NUCLEAR SSS: 7
CHL CUP STRESS STAGE 1 DBP: 71 mmHg
CHL CUP STRESS STAGE 1 HR: 55 {beats}/min
CHL CUP STRESS STAGE 1 SBP: 108 mmHg
CHL CUP STRESS STAGE 2 GRADE: 0 %
CHL CUP STRESS STAGE 3 HR: 68 {beats}/min
CHL CUP STRESS STAGE 5 HR: 146 {beats}/min
CHL CUP STRESS STAGE 6 GRADE: 0 %
CHL CUP STRESS STAGE 7 DBP: 56 mmHg
CHL CUP STRESS STAGE 7 GRADE: 0 %
CHL RATE OF PERCEIVED EXERTION: 17
CSEPEW: 7 METS
CSEPPBP: 168 mmHg
CSEPPHR: 146 {beats}/min
Exercise duration (min): 5 min
Exercise duration (sec): 30 s
LHR: 0.32
LV sys vol: 38 mL
LVDIAVOL: 91 mL
MPHR: 162 {beats}/min
Percent HR: 90 %
Percent of predicted max HR: 90 %
Rest HR: 54 {beats}/min
Stage 1 Grade: 0 %
Stage 1 Speed: 0 mph
Stage 2 DBP: 70 mmHg
Stage 2 HR: 68 {beats}/min
Stage 2 SBP: 115 mmHg
Stage 2 Speed: 0 mph
Stage 3 Grade: 0 %
Stage 3 Speed: 0 mph
Stage 4 Grade: 10 %
Stage 4 HR: 133 {beats}/min
Stage 4 Speed: 1.7 mph
Stage 5 DBP: 53 mmHg
Stage 5 Grade: 12 %
Stage 5 SBP: 168 mmHg
Stage 5 Speed: 2.5 mph
Stage 6 DBP: 48 mmHg
Stage 6 HR: 125 {beats}/min
Stage 6 SBP: 176 mmHg
Stage 6 Speed: 0 mph
Stage 7 HR: 76 {beats}/min
Stage 7 SBP: 110 mmHg
Stage 7 Speed: 0 mph
TID: 0.98

## 2015-12-04 MED ORDER — TECHNETIUM TC 99M SESTAMIBI GENERIC - CARDIOLITE
31.8000 | Freq: Once | INTRAVENOUS | Status: AC | PRN
  Administered 2015-12-04: 31.8 via INTRAVENOUS

## 2015-12-04 MED ORDER — TECHNETIUM TC 99M SESTAMIBI GENERIC - CARDIOLITE
11.0000 | Freq: Once | INTRAVENOUS | Status: AC | PRN
  Administered 2015-12-04: 11 via INTRAVENOUS

## 2015-12-05 ENCOUNTER — Telehealth: Payer: Self-pay

## 2015-12-05 DIAGNOSIS — R079 Chest pain, unspecified: Secondary | ICD-10-CM

## 2015-12-05 DIAGNOSIS — R9439 Abnormal result of other cardiovascular function study: Secondary | ICD-10-CM

## 2015-12-05 NOTE — Telephone Encounter (Signed)
Informed patient of results and verbal understanding expressed.  Coronary CT ordered for precert. Patient requests to call her insurance to find out out of pocket pay prior to scheduling. [facility] notified.

## 2015-12-05 NOTE — Telephone Encounter (Signed)
-----   Message from Quintella Reichert, MD sent at 12/04/2015 10:30 PM EST ----- No ischemia on stress test but she had chest pain and EKG changes during ETT.  Please get a coronary CTA with morphology to evaluate further

## 2015-12-07 ENCOUNTER — Telehealth: Payer: Self-pay | Admitting: Cardiology

## 2015-12-07 NOTE — Telephone Encounter (Signed)
Patient st she will go ahead with the CT, but would like a call from Billing to discuss codes so she can get a more definite pricing for the test. Message sent to Billing to call patient.

## 2015-12-07 NOTE — Telephone Encounter (Signed)
New message     Calling regarding cardiac CT Dr Mayford Knife wanted to schedule

## 2015-12-14 ENCOUNTER — Telehealth: Payer: Self-pay | Admitting: Cardiology

## 2015-12-14 NOTE — Telephone Encounter (Signed)
Called pt and gave approximate Cardiac CTA price of $1800.  Pt states, Julia Wilkerson, [facility] already gave her CPT code [address].  Pt has no other questions.

## 2015-12-17 ENCOUNTER — Telehealth: Payer: Self-pay | Admitting: Cardiology

## 2015-12-17 DIAGNOSIS — R079 Chest pain, unspecified: Secondary | ICD-10-CM

## 2015-12-17 NOTE — Telephone Encounter (Signed)
Per pt call she would like to know if there are any other options to Cardiac CT.   Please give her a call back.

## 2015-12-17 NOTE — Telephone Encounter (Signed)
Left message to call back  

## 2015-12-18 NOTE — Telephone Encounter (Signed)
Lets start with CT just for calcium score

## 2015-12-18 NOTE — Telephone Encounter (Signed)
Patient st she cannot afford cardiac CT and would like to know if Dr. Mayford Knife has any other alternative treatment options.   To Dr. Mayford Knife.

## 2015-12-20 NOTE — Telephone Encounter (Signed)
Patient agrees to calcium score.  Calcium score ordered for scheduling. CT cancelled. Patient agrees with treatment plan.

## 2015-12-20 NOTE — Telephone Encounter (Signed)
Left message to call back  

## 2015-12-20 NOTE — Telephone Encounter (Signed)
Returning your call. °

## 2015-12-25 ENCOUNTER — Ambulatory Visit (INDEPENDENT_AMBULATORY_CARE_PROVIDER_SITE_OTHER)
Admission: RE | Admit: 2015-12-25 | Discharge: 2015-12-25 | Disposition: A | Discharge status: Home / Self Care | Payer: Self-pay | Source: Ambulatory Visit | Attending: Cardiology | Admitting: Cardiology

## 2015-12-25 DIAGNOSIS — R079 Chest pain, unspecified: Secondary | ICD-10-CM

## 2015-12-26 ENCOUNTER — Telehealth: Payer: Self-pay | Admitting: Cardiology

## 2015-12-26 NOTE — Telephone Encounter (Signed)
Pt ready to schedule cardiac ct

## 2016-01-07 ENCOUNTER — Encounter: Payer: Self-pay | Admitting: Cardiology

## 2016-01-07 ENCOUNTER — Other Ambulatory Visit: Payer: Self-pay

## 2016-01-07 DIAGNOSIS — R079 Chest pain, unspecified: Secondary | ICD-10-CM

## 2016-01-15 ENCOUNTER — Encounter (HOSPITAL_COMMUNITY): Payer: Self-pay

## 2016-01-15 ENCOUNTER — Ambulatory Visit (HOSPITAL_COMMUNITY)
Admission: RE | Admit: 2016-01-15 | Discharge: 2016-01-15 | Disposition: A | Discharge status: Home / Self Care | Payer: Managed Care, Other (non HMO) | Source: Ambulatory Visit | Attending: Cardiology | Admitting: Cardiology

## 2016-01-15 DIAGNOSIS — R918 Other nonspecific abnormal finding of lung field: Secondary | ICD-10-CM | POA: Insufficient documentation

## 2016-01-15 DIAGNOSIS — R079 Chest pain, unspecified: Secondary | ICD-10-CM | POA: Insufficient documentation

## 2016-01-15 MED ORDER — METOPROLOL TARTRATE 1 MG/ML IV SOLN
INTRAVENOUS | Status: AC
  Administered 2016-01-15: 2.5 mg via INTRAVENOUS
  Filled 2016-01-15: qty 5

## 2016-01-15 MED ORDER — NITROGLYCERIN 0.4 MG SL SUBL
SUBLINGUAL_TABLET | SUBLINGUAL | Status: AC
  Filled 2016-01-15: qty 1

## 2016-01-15 MED ORDER — IOHEXOL 350 MG/ML SOLN
80.0000 mL | Freq: Once | INTRAVENOUS | Status: AC | PRN
  Administered 2016-01-15: 80 mL via INTRAVENOUS

## 2016-01-15 MED ORDER — NITROGLYCERIN 0.4 MG SL SUBL
0.8000 mg | SUBLINGUAL_TABLET | Freq: Once | SUBLINGUAL | Status: DC
  Filled 2016-01-15: qty 25

## 2016-01-15 MED ORDER — METOPROLOL TARTRATE 1 MG/ML IV SOLN
2.5000 mg | Freq: Once | INTRAVENOUS | Status: AC
  Administered 2016-01-15: 2.5 mg via INTRAVENOUS
  Filled 2016-01-15: qty 5

## 2016-01-17 ENCOUNTER — Other Ambulatory Visit: Payer: Self-pay

## 2016-02-12 ENCOUNTER — Encounter: Payer: Self-pay | Admitting: Cardiology

## 2016-02-25 ENCOUNTER — Other Ambulatory Visit: Payer: Self-pay | Admitting: Cardiology

## 2016-04-08 IMAGING — CT CT HEART MORP W/ CTA COR W/ SCORE W/ CA W/CM &/OR W/O CM
1 of 10 series · 1 of 20 positions shown, 2 images · non-contrast
Comparison: 12/25/2015 coronary calcium CT.

EXAM:
OVER-READ INTERPRETATION  CT CHEST

The following report is an over-read performed by radiologist Dr.
does not include interpretation of cardiac or coronary anatomy or
pathology. The coronary CTA interpretation by the cardiologist is
attached.
CLINICAL DATA: Chest pain
Cardiac CTA
MEDICATIONS:
Sub lingual nitro. 4mg and lopressor 5mg
TECHNIQUE: The patient was scanned on a [employer] [REDACTED]ice scanner. Gantry
rotation speed was 270 msecs. Collimation was .9mm. A 100 kV
prospective scan was triggered in the descending thoracic aorta at
111 HU's with 5% padding centered around 78% of the R-R interval.
Average HR during the scan was 58 bpm. The 3D data set was
interpreted on a dedicated work station using MPR, MIP and VRT
modes. A total of 80cc of contrast was used.

[Series 300: locator · axial · 0.35mm/px · z∈[-138,-138]mm · 1 of 1 slices shown, 2 images]
[im 1/1  vessel]
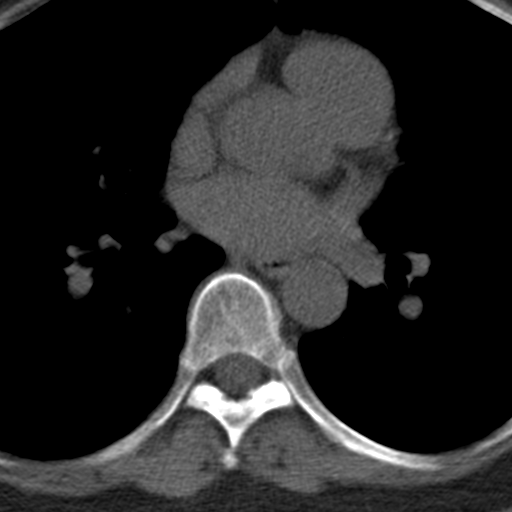
[im 1/1  lung]
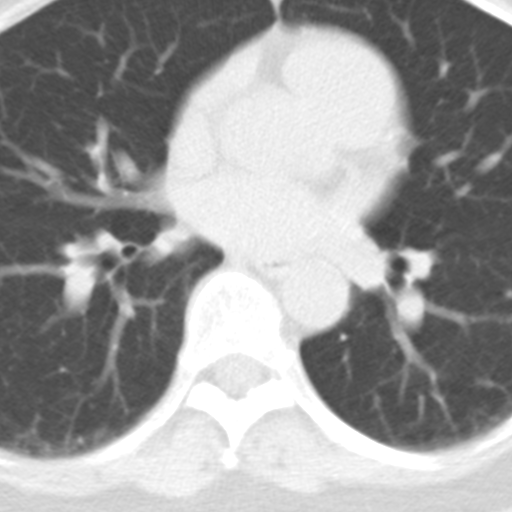

[1 of 20 positions shown; findings below may reference images not displayed]

FINDINGS: No mediastinal or hilar lymphadenopathy. Visualized esophagus
appears normal. No pleural effusions or pneumothorax. No acute
consolidative airspace disease or new significant pulmonary nodules.
Stable thin-walled air cyst in anterior right upper lobe, probably a
pneumatocele. Anterior right lower lobe subpleural 3 mm pulmonary
nodule associated with the right major fissure (series 204/ image
26) is unchanged since 06/18/2015. Two adjacent 6 mm subpleural
pulmonary nodules in the anterior left lower lobe associated with
the left major fissure (series 204/ image 25) are unchanged since
06/18/2015. Peripheral left lower lobe 4 mm subpleural pulmonary
nodule (series 204/ image 56) is stable since 06/18/2015. No
aggressive appearing focal osseous lesions. Unremarkable visualized
upper abdomen.
IMPRESSION: Incidental extracardiac CT findings are unchanged since 12/25/2015.
Scattered subpleural pulmonary nodules, largest 6 mm in the left
lower lobe, all unchanged since 06/18/2015, in keeping with 6 month
stability, probably benign. If the patient is at high risk for
bronchogenic carcinoma, follow-up chest CT at 6-12 months is
recommended. If the patient is at low risk for bronchogenic
carcinoma, follow-up chest CT at 12 months is recommended. This
recommendation follows the consensus statement: Guidelines for
Management of Small Pulmonary Nodules Detected on CT Scans: A
Statement from the [HOSPITAL] as published in Radiology
5445;[DATE].
FINDINGS: Non-cardiac: See separate report from [REDACTED]. No
significant findings on limited lung and soft tissue windows.

Calcium Score:  96 Two dense areas of calcification in mid LAD

Coronary Arteries: Right dominant with no anomalies

LM: Normal

LAD: Less than 30% calcific lesion in mid LAD just after D1 take off

D1: Normal

D2: Normal

Circumflex: Normal

OM1: Normal

RCA:  Dominant and normal

PDA: Normal

PLA:  Normal
IMPRESSION: 1) Right dominant coronary arteries with no obstructive disease Less
than 30% calcified lesion in mid LAD

2) Calcium score 96 two lesions in mid LAD 91st percentile for age
and sex matched controls

Mbere Sarko

## 2016-06-06 ENCOUNTER — Ambulatory Visit (INDEPENDENT_AMBULATORY_CARE_PROVIDER_SITE_OTHER): Payer: Managed Care, Other (non HMO) | Admitting: Cardiology

## 2016-06-06 ENCOUNTER — Encounter: Payer: Self-pay | Admitting: Cardiology

## 2016-06-06 VITALS — BP 132/78 | HR 70 | Ht 68.0 in | Wt 173.0 lb

## 2016-06-06 DIAGNOSIS — I2510 Atherosclerotic heart disease of native coronary artery without angina pectoris: Secondary | ICD-10-CM | POA: Diagnosis not present

## 2016-06-06 DIAGNOSIS — I480 Paroxysmal atrial fibrillation: Secondary | ICD-10-CM | POA: Diagnosis not present

## 2016-06-06 DIAGNOSIS — I493 Ventricular premature depolarization: Secondary | ICD-10-CM | POA: Diagnosis not present

## 2016-06-06 MED ORDER — METOPROLOL TARTRATE 25 MG PO TABS: 12.5000 mg | ORAL_TABLET | Freq: Two times a day (BID) | ORAL | Status: DC

## 2016-06-06 MED ORDER — RIVAROXABAN 20 MG PO TABS: ORAL_TABLET | ORAL | Status: DC

## 2016-06-06 NOTE — Progress Notes (Addendum)
Cardiology Office Note    Date:  06/06/2016   ID:  Julia Wilkerson, DOB Oct 03, 1956, MRN [id number]  PCP:  Cala Bradford, MD  Cardiologist:  Armanda Magic, MD   Chief Complaint  Patient presents with  . Atrial Fibrillation  . Coronary Artery Disease  . Hyperlipidemia    History of Present Illness:  Julia Wilkerson is a 60 y.o. female with a history of paroxysmal atrial fibrillation on  Xarelto, digoxin, metoprolol.She also has a history of SVT, PACs, PVCs and nonobstructive ASCAD with a 30% plaque in the LAD by coronary CTA.  She is doing well today.  SHe denies any chest pain, SOB, DOE, LE edema, dizziness, palpitations or syncope.    Past Medical History  Diagnosis Date  . High cholesterol   . PVC (premature ventricular contraction) 11/28/2015  . CAD (coronary artery disease), native coronary artery     30% LAD by coronary CTA  . PAF (paroxysmal atrial fibrillation) (HCC)     CHADS2VASC score is 2 (female and CAD)    Past Surgical History  Procedure Laterality Date  . None      Current Medications: Outpatient Prescriptions Prior to Visit  Medication Sig Dispense Refill  . acetaminophen (TYLENOL) 500 MG tablet Take 500 mg by mouth every 6 (six) hours as needed for mild pain, fever or headache.    Marland Kitchen atorvastatin (LIPITOR) 20 MG tablet Take 20 mg by mouth daily.    . methimazole (TAPAZOLE) 10 MG tablet 5 mg daily.     . metoprolol tartrate (LOPRESSOR) 25 MG tablet Take 0.5 tablets (12.5 mg total) by mouth 2 (two) times daily. 30 tablet 11  . XARELTO 20 MG TABS tablet TAKE ONE TABLET BY MOUTH ONCE DAILY WITH  SUPPER 30 tablet 3   No facility-administered medications prior to visit.     Allergies:   Lamisil; Azo; Penicillins; and Septra   Social History   Social History  . Marital Status: Married    Spouse Name: N/A  . Number of Children: N/A  . Years of Education: N/A   Occupational History  . Not employed    Social History Main Topics  . Smoking status:  Never Smoker   . Smokeless tobacco: Never Used  . Alcohol Use: No  . Drug Use: None  . Sexual Activity: Yes   Other Topics Concern  . None   Social History Narrative   Lives with husband and her mother (cares for her mother).     Family History:  The patient's family history includes Glaucoma in her mother; Heart attack in her mother; Hypothyroidism in her mother; Osteopenia in her mother.   ROS:   Please see the history of present illness.    ROS All other systems reviewed and are negative.   PHYSICAL EXAM:   VS:  BP 132/78 mmHg  Pulse 70  Ht  (1.727 m)  Wt 173 lb (78.472 kg)  BMI 26.31 kg/m2  SpO2 98%   GEN: Well nourished, well developed, in no acute distress HEENT: normal Neck: no JVD, carotid bruits, or masses Cardiac: RRR; no murmurs, rubs, or gallops,no edema.  Intact distal pulses bilaterally.  Respiratory:  clear to auscultation bilaterally, normal work of breathing GI: soft, nontender, nondistended, + BS MS: no deformity or atrophy Skin: warm and dry, no rash Neuro:  Alert and Oriented x 3, Strength and sensation are intact Psych: euthymic mood, full affect  Wt Readings from Last 3 Encounters:  06/06/16 173 lb (78.472 kg)  12/04/15 163 lb (73.936 kg)  11/28/15 163 lb (73.936 kg)      Studies/Labs Reviewed:   EKG:  EKG is not ordered today.  Recent Labs: 06/18/2015: BUN 9; Creatinine, Ser 0.63; Potassium 3.7; Sodium 138 06/20/2015: ALT 79* 06/21/2015: Hemoglobin 13.3; Platelets 202 09/12/2015: TSH 53.262*   Lipid Panel No results found for: CHOL, TRIG, HDL, CHOLHDL, VLDL, LDLCALC, LDLDIRECT  Additional studies/ records that were reviewed today include:  none    ASSESSMENT:    1. Paroxysmal a-fib (HCC)   2. PVC (premature ventricular contraction)   3. Coronary artery disease involving native coronary artery of native heart without angina pectoris      PLAN:  In order of problems listed above:  1. PAF maintaining NSR.  Continue Xarelto  and BB. 2. PVCs - suppressed on BB. 3. ASCAD with nonobstructive CAD of the LAD of 30%.  She has no angina.  Continue statin and BB. Not on ASA due to NOAC.    Medication Adjustments/Labs and Tests Ordered: Current medicines are reviewed at length with the patient today.  Concerns regarding medicines are outlined above.  Medication changes, Labs and Tests ordered today are listed in the Patient Instructions below.  There are no Patient Instructions on file for this visit.   Signed, Armanda Magicraci Lakia Gritton, MD  06/06/2016 8:20 AM    [facility] [address], [place], [place]  [address] Phone: [phone](336) [phone]; Fax: [phone](336) [phone]

## 2016-06-06 NOTE — Patient Instructions (Signed)
Medication Instructions:  Your physician recommends that you continue on your current medications as directed. Please refer to the Current Medication list given to you today.  Labwork: None ordered  Testing/Procedures: None ordered  Follow-Up: Your physician wants you to follow-up in: 6 months with Dr. Mayford Knifeurner. You will receive a reminder letter in the mail two months in advance. If you don't receive a letter, please call our office to schedule the follow-up appointment.   If you need a refill on your cardiac medications before your next appointment, please call your pharmacy.  Thank you for choosing [facility]!!

## 2016-06-06 NOTE — Addendum Note (Signed)
Addended by: Armanda MagicURNER, TRACI R on: 06/06/2016 11:50 AM   Modules accepted: K[employer] Brood[employer]

## 2016-12-10 ENCOUNTER — Telehealth: Payer: Self-pay

## 2016-12-10 NOTE — Telephone Encounter (Signed)
Letter from [employer] stating patient is eligible for Assistance Program for Xarelto. She will be receiving a letter and pharmacy card in the mail to use at her local pharmacy.

## 2016-12-19 ENCOUNTER — Encounter: Payer: Self-pay | Admitting: Cardiology

## 2016-12-19 ENCOUNTER — Ambulatory Visit (INDEPENDENT_AMBULATORY_CARE_PROVIDER_SITE_OTHER): Payer: Self-pay | Admitting: Cardiology

## 2016-12-19 VITALS — BP 110/78 | HR 60 | Ht 69.0 in | Wt 177.4 lb

## 2016-12-19 DIAGNOSIS — I2510 Atherosclerotic heart disease of native coronary artery without angina pectoris: Secondary | ICD-10-CM

## 2016-12-19 DIAGNOSIS — I493 Ventricular premature depolarization: Secondary | ICD-10-CM

## 2016-12-19 DIAGNOSIS — I481 Persistent atrial fibrillation: Secondary | ICD-10-CM

## 2016-12-19 DIAGNOSIS — I4819 Other persistent atrial fibrillation: Secondary | ICD-10-CM

## 2016-12-19 NOTE — Patient Instructions (Signed)
Medication Instructions:  None  Labwork: None  Testing/Procedures: None  Follow-Up: Your physician wants you to follow-up in: 1 year with Dr. Turner. You will receive a reminder letter in the mail two months in advance. If you don't receive a letter, please call our office to schedule the follow-up appointment.   Any Other Special Instructions Will Be Listed Below (If Applicable).    If you need a refill on your cardiac medications before your next appointment, please call your pharmacy.   

## 2016-12-19 NOTE — Progress Notes (Signed)
Cardiology Office Note    Date:  12/19/2016   ID:  Julia Wilkerson, DOB 05-24-1956, MRN [id number]  PCP:  Cala Bradford, MD  Cardiologist:  Armanda Magic, MD   No chief complaint on file.   History of Present Illness:  Julia Wilkerson is a 61 y.o. female with a history of persistent atrial fibrillation on Xarelto for a CHADS2VASC score of 2.  She also has a history of SVT, PACs, PVCs and nonobstructive ASCAD with a 30% plaque in the LAD by coronary CTA.  She is doing well today.  She denies any chest pain, LE edema, dizziness, palpitations, claudication or syncope. She rarely will have some mild DOE which she has had in the past and has not changed.  She has hyperthyroidism and is on Methimazole.    Past Medical History:  Diagnosis Date  . CAD (coronary artery disease), native coronary artery    30% LAD by coronary CTA  . High cholesterol   . Persistent atrial fibrillation (HCC)    CHADS2VASC score is 2 (female and CAD)  . PVC (premature ventricular contraction) 11/28/2015    Past Surgical History:  Procedure Laterality Date  . None      Current Medications: Outpatient Medications Prior to Visit  Medication Sig Dispense Refill  . acetaminophen (TYLENOL) 500 MG tablet Take 500 mg by mouth every 6 (six) hours as needed for mild pain, fever or headache.    . methimazole (TAPAZOLE) 10 MG tablet 5 mg daily.     . metoprolol tartrate (LOPRESSOR) 25 MG tablet Take 0.5 tablets (12.5 mg total) by mouth 2 (two) times daily. 30 tablet 6  . rivaroxaban (XARELTO) 20 MG TABS tablet TAKE ONE TABLET BY MOUTH ONCE DAILY WITH  SUPPER 30 tablet 6  . atorvastatin (LIPITOR) 20 MG tablet Take 20 mg by mouth daily.     No facility-administered medications prior to visit.      Allergies:   Lamisil [terbinafine hcl]; Azo [phenazopyridine]; Penicillins; and Septra [sulfamethoxazole-trimethoprim]   Social History   Social History  . Marital status: Married    Spouse name: N/A  . Number of  children: N/A  . Years of education: N/A   Occupational History  . Not employed    Social History Main Topics  . Smoking status: Never Smoker  . Smokeless tobacco: Never Used  . Alcohol use No  . Drug use: Unknown  . Sexual activity: Yes   Other Topics Concern  . None   Social History Narrative   Lives with husband and her mother (cares for her mother).     Family History:  The patient's family history includes Glaucoma in her mother; Heart attack in her mother; Hypothyroidism in her mother; Osteopenia in her mother.   ROS:   Please see the history of present illness.    ROS All other systems reviewed and are negative.  No flowsheet data found.     PHYSICAL EXAM:   VS:  BP 110/78   Pulse 60   Ht 5\' 9"  (1.753 m)   Wt 177 lb 6.4 oz (80.5 kg)   BMI 26.20 kg/m    GEN: Well nourished, well developed, in no acute distress  HEENT: normal  Neck: no JVD, carotid bruits, or masses Cardiac: RRR; no murmurs, rubs, or gallops,no edema.  Intact distal pulses bilaterally.  Respiratory:  clear to auscultation bilaterally, normal work of breathing GI: soft, nontender, nondistended, + BS MS: no deformity or atrophy  Skin: warm and dry, no  rash Neuro:  Alert and Oriented x 3, Strength and sensation are intact Psych: euthymic mood, full affect  Wt Readings from Last 3 Encounters:  12/19/16 177 lb 6.4 oz (80.5 kg)  06/06/16 173 lb (78.5 kg)  12/04/15 163 lb (73.9 kg)      Studies/Labs Reviewed:   EKG:  EKG is ordered today.  The ekg ordered today demonstrates NSR at 60bpm with no ST changes  Recent Labs: No results found for requested labs within last 8760 hours.   Lipid Panel No results found for: CHOL, TRIG, HDL, CHOLHDL, VLDL, LDLCALC, LDLDIRECT  Additional studies/ records that were reviewed today include:  none    ASSESSMENT:    1. Coronary artery disease involving native coronary artery of native heart without angina pectoris   2. Persistent atrial  fibrillation (HCC)   3. PVC (premature ventricular contraction)      PLAN:  In order of problems listed above:  1. Nonobstructive ASCAD with 30% LAD by coronary CTA - she has not had any anginal CP. 2. Persistent atrial fibrillation maintaining NSR.  She will continue on BB and Xarelto. Her PCP is checking her blood work this week and I asked her to have her PCP forward results to me.  3. PVC's - asymptomatic .  Continue BB.    Medication Adjustments/Labs and Tests Ordered: Current medicines are reviewed at length with the patient today.  Concerns regarding medicines are outlined above.  Medication changes, Labs and Tests ordered today are listed in the Patient Instructions below.  There are no Patient Instructions on file for this visit.   Signed, Armanda Magicraci Risha Barretta, MD  12/19/2016 8:59 AM    [facility] [address], [place], [place]  [address] Phone: [phone](336) [phone]; Fax: [phone](336) [phone]

## 2017-01-14 ENCOUNTER — Telehealth: Payer: Self-pay | Admitting: Cardiology

## 2017-01-14 DIAGNOSIS — I2510 Atherosclerotic heart disease of native coronary artery without angina pectoris: Secondary | ICD-10-CM

## 2017-01-14 NOTE — Telephone Encounter (Signed)
New message   Pt state someone called her about changing cholesterol medication and assistance if she does not have insurance. She wants to know what the change in medication will be and to know about the assistance.

## 2017-01-14 NOTE — Telephone Encounter (Signed)
Patient states her cholesterol recheck was still elevated at her PCP. Labs were requested but never received. She states her LDL was elevated again and she also requests to change medications for money purposes. Informed her labs will be requested again tomorrow and she will be called with medication recommendations after they are received.  She was grateful for call.   Per result notes: Notes Recorded by Henrietta DineKathryn A Tishanna Dunford, RN on 12/24/2016 at 2:59 PM EST Patient states she just started Lovastatin when she had these labs drawn. She has follow-up labs scheduled 2/20. Will request lab work at that time to see if changes need to be made. Patient also reports she will not restart Lipitor as she does not have insurance and it was over $100 monthly for the medication.  Will get labs 2/20. ------  Notes Recorded by Quintella Reichertraci R Turner, MD on 12/24/2016 at 7:34 AM EST LDL not at goal - change to Lipitor 40mg  daily and repeat FLP and ALT in 6 weeks

## 2017-01-20 MED ORDER — ATORVASTATIN CALCIUM 40 MG PO TABS: 40.0000 mg | ORAL_TABLET | Freq: Every day | ORAL | 11 refills | Status: DC

## 2017-01-20 NOTE — Telephone Encounter (Signed)
Received labs from PCP. LDL 136. Discussed with Margaretmary DysMegan Supple, RPH.  Since cost is an issue, Aundra MilletMegan recommends patient STOP LOVASTATIN and START LIPITOR 40 mg daily. If it is called in to [employer] [employer], it should be roughly $10 monthly and she does not need a membership. Discussed with patient, who agrees to try [facility]. FLP and ALT scheduled 5/1. Patient agrees with treatment plan and is grateful for call.

## 2017-03-17 ENCOUNTER — Other Ambulatory Visit: Payer: Self-pay | Admitting: *Deleted

## 2017-03-17 ENCOUNTER — Telehealth: Payer: Self-pay

## 2017-03-17 DIAGNOSIS — E785 Hyperlipidemia, unspecified: Secondary | ICD-10-CM

## 2017-03-17 DIAGNOSIS — I2510 Atherosclerotic heart disease of native coronary artery without angina pectoris: Secondary | ICD-10-CM

## 2017-03-17 LAB — LIPID PANEL
Chol/HDL Ratio: 2.5 ratio (ref 0.0–4.4)
Cholesterol, Total: 161 mg/dL (ref 100–199)
HDL: 65 mg/dL (ref >39)
LDL Calculated: 79 mg/dL (ref 0–99)
Triglycerides: 84 mg/dL (ref 0–149)
VLDL Cholesterol Cal: 17 mg/dL (ref 5–40)

## 2017-03-17 LAB — ALT: ALT: 17 IU/L (ref 0–32)

## 2017-03-17 NOTE — Telephone Encounter (Signed)
-----   Message from Quintella Reichert, MD sent at 03/17/2017  4:46 PM EDT ----- LDL goal < 70.  Please have her tighten up her low fat diet and repeat FLP in 3 months

## 2017-03-17 NOTE — Telephone Encounter (Signed)
Informed patient of results and verbal understanding expressed.  Encouraged patient to tighten up on low fat diet. FLP scheduled 8/3. Patient agrees with treatment plan.

## 2017-05-21 ENCOUNTER — Other Ambulatory Visit: Payer: Self-pay | Admitting: Cardiology

## 2017-06-06 ENCOUNTER — Other Ambulatory Visit: Payer: Self-pay | Admitting: Cardiology

## 2017-06-08 NOTE — Telephone Encounter (Signed)
Pt last saw Dr Mayford Knifeurner 12/19/16, last labs 10/22/16 Creat 0.61, weight 80.5kg, age 61, CrCl 124.64, based on CrCl pt is on appropriate dosage of Xarelto 20mg  QD.  Will refill rx.

## 2017-06-19 ENCOUNTER — Other Ambulatory Visit: Payer: Self-pay | Admitting: *Deleted

## 2017-06-19 DIAGNOSIS — E785 Hyperlipidemia, unspecified: Secondary | ICD-10-CM

## 2017-06-19 LAB — LIPID PANEL
CHOL/HDL RATIO: 2.6 ratio (ref 0.0–4.4)
Cholesterol, Total: 127 mg/dL (ref 100–199)
HDL: 48 mg/dL (ref >39)
LDL CALC: 54 mg/dL (ref 0–99)
Triglycerides: 125 mg/dL (ref 0–149)
VLDL Cholesterol Cal: 25 mg/dL (ref 5–40)

## 2017-11-04 ENCOUNTER — Other Ambulatory Visit: Payer: Self-pay

## 2017-11-12 ENCOUNTER — Telehealth: Payer: Self-pay | Admitting: *Deleted

## 2017-11-12 NOTE — Telephone Encounter (Signed)
Received signed provider form from Dr Mayford Knifeurner dated 11/05/17 for [facility] assistance with Carlena HurlXARELTO. Faxed to [employer].

## 2017-11-26 ENCOUNTER — Other Ambulatory Visit: Payer: Self-pay | Admitting: Cardiology

## 2017-11-26 MED ORDER — METOPROLOL TARTRATE 25 MG PO TABS: 12.5000 mg | ORAL_TABLET | Freq: Two times a day (BID) | ORAL | 0 refills | Status: DC

## 2017-12-14 ENCOUNTER — Telehealth: Payer: Self-pay

## 2017-12-14 NOTE — Telephone Encounter (Signed)
The pt dropped off her [employer] pt assistance application for Xarelto. I have completed to provider part of the application and placed it on Dr Malachy Moodurners cart per Artelia Larocheena her nurse to be signed while Dr Mayford Knifeurner is in the office tomorrow.

## 2017-12-17 NOTE — Telephone Encounter (Signed)
Faxed completed patient medication assistant application to [employer] for [employer].

## 2017-12-28 ENCOUNTER — Other Ambulatory Visit: Payer: Self-pay | Admitting: Cardiology

## 2018-01-16 ENCOUNTER — Other Ambulatory Visit: Payer: Self-pay | Admitting: Cardiology

## 2018-02-03 ENCOUNTER — Encounter: Payer: Self-pay | Admitting: Cardiology

## 2018-02-11 ENCOUNTER — Encounter: Payer: Self-pay | Admitting: Cardiology

## 2018-02-11 ENCOUNTER — Telehealth: Payer: Self-pay | Admitting: *Deleted

## 2018-02-11 ENCOUNTER — Encounter: Payer: Self-pay | Admitting: *Deleted

## 2018-02-11 ENCOUNTER — Ambulatory Visit: Payer: BLUE CROSS/BLUE SHIELD | Admitting: Cardiology

## 2018-02-11 VITALS — BP 122/74 | HR 59 | Ht 67.5 in | Wt 180.4 lb

## 2018-02-11 DIAGNOSIS — I481 Persistent atrial fibrillation: Secondary | ICD-10-CM

## 2018-02-11 DIAGNOSIS — I493 Ventricular premature depolarization: Secondary | ICD-10-CM | POA: Diagnosis not present

## 2018-02-11 DIAGNOSIS — I2510 Atherosclerotic heart disease of native coronary artery without angina pectoris: Secondary | ICD-10-CM

## 2018-02-11 DIAGNOSIS — I4819 Other persistent atrial fibrillation: Secondary | ICD-10-CM

## 2018-02-11 LAB — BASIC METABOLIC PANEL
BUN / CREAT RATIO: 15 (ref 12–28)
BUN: 12 mg/dL (ref 8–27)
CO2: 26 mmol/L (ref 20–29)
Calcium: 9.6 mg/dL (ref 8.7–10.3)
Chloride: 101 mmol/L (ref 96–106)
Creatinine, Ser: 0.81 mg/dL (ref 0.57–1.00)
GFR, EST AFRICAN AMERICAN: 91 mL/min/{1.73_m2} (ref >59)
GFR, EST NON AFRICAN AMERICAN: 79 mL/min/{1.73_m2} (ref >59)
Glucose: 99 mg/dL (ref 65–99)
POTASSIUM: 4.3 mmol/L (ref 3.5–5.2)
SODIUM: 138 mmol/L (ref 134–144)

## 2018-02-11 NOTE — Telephone Encounter (Signed)
Patient in office for MD visit, she ask the status of her [employer] patient assistance for [employer]. I called and they stated it was missing information then the rep said no we have all the information now,  She will submit and will have an answer by beginning of next week. I informed patient via Lyda Peroneena Robertson, RN.

## 2018-02-11 NOTE — Patient Instructions (Signed)
Medication Instructions:  Your physician recommends that you continue on your current medications as directed. Please refer to the Current Medication list given to you today.  Labwork: Today for kidney function  Testing/Procedures: None ordered   Follow-Up: Your physician wants you to follow-up in: 1 year with Dr. Mayford Knifeurner. You will receive a reminder letter in the mail two months in advance. If you don't receive a letter, please call our office to schedule the follow-up appointment.  Any Other Special Instructions Will Be Listed Below (If Applicable).  Thank you for choosing [facility]    Lyda PeroneRena Manly Nestle, RN  [phone]   If you need a refill on your cardiac medications before your next appointment, please call your pharmacy.

## 2018-02-11 NOTE — Telephone Encounter (Signed)
This encounter was created in error - please disregard.

## 2018-02-11 NOTE — Progress Notes (Signed)
Cardiology Office Note:    Date:  02/11/2018   ID:  Julia Wilkerson, DOB Jul 02, 1956, MRN [id number]  PCP:  Laurann MontanaWhite, Cynthia, MD  Cardiologist:  No primary care provider on file.    Referring MD: Laurann MontanaWhite, Cynthia, MD   Chief Complaint  Patient presents with  . Coronary Artery Disease  . Atrial Fibrillation    History of Present Illness:    Julia PyleGeraldine Wilkerson is a 62 y.o. female with a hx of persistent atrial fibrillation on Xarelto for a CHADS2VASC score of 2.  She also has a history of SVT, PACs, PVCs and nonobstructive ASCAD with a 30% plaque in the LAD by coronary CTA.  She is here today for followup and is doing well.  She denies any chest pain or pressure, SOB, DOE, PND, orthopnea, LE edema, dizziness, palpitations or syncope. She is compliant with her meds and is tolerating meds with no SE.    Past Medical History:  Diagnosis Date  . CAD (coronary artery disease), native coronary artery    30% LAD by coronary CTA  . High cholesterol   . Persistent atrial fibrillation (HCC)    CHADS2VASC score is 2 (female and CAD)  . PVC (premature ventricular contraction) 11/28/2015    Past Surgical History:  Procedure Laterality Date  . None      Current Medications: Current Meds  Medication Sig  . acetaminophen (TYLENOL) 500 MG tablet Take 500 mg by mouth every 6 (six) hours as needed for mild pain, fever or headache.  Marland Kitchen. atorvastatin (LIPITOR) 40 MG tablet Take 1 tablet (40 mg total) by mouth daily. Please keep upcoming appt for future refills. Thank you  . metoprolol tartrate (LOPRESSOR) 25 MG tablet Take 0.5 tablets (12.5 mg total) by mouth 2 (two) times daily. Please keep 01/2018 appointment for further refills  . XARELTO 20 MG TABS tablet TAKE ONE TABLET BY MOUTH ONCE DAILY WITH SUPPER     Allergies:   Lamisil [terbinafine]; Azo [phenazopyridine]; Penicillins; and Septra [sulfamethoxazole-trimethoprim]   Social History   Socioeconomic History  . Marital status: Married    Spouse  name: Not on file  . Number of children: Not on file  . Years of education: Not on file  . Highest education level: Not on file  Occupational History  . Occupation: Not employed  Social Needs  . Financial resource strain: Not on file  . Food insecurity:    Worry: Not on file    Inability: Not on file  . Transportation needs:    Medical: Not on file    Non-medical: Not on file  Tobacco Use  . Smoking status: Never Smoker  . Smokeless tobacco: Never Used  Substance and Sexual Activity  . Alcohol use: No    Alcohol/week: 0.0 oz  . Drug use: No  . Sexual activity: Yes  Lifestyle  . Physical activity:    Days per week: Not on file    Minutes per session: Not on file  . Stress: Not on file  Relationships  . Social connections:    Talks on phone: Not on file    Gets together: Not on file    Attends religious service: Not on file    Active member of club or organization: Not on file    Attends meetings of clubs or organizations: Not on file    Relationship status: Not on file  Other Topics Concern  . Not on file  Social History Narrative   Lives with husband and her mother (cares  for her mother).     Family History: The patient's family history includes Glaucoma in her mother; Heart attack in her mother; Hypothyroidism in her mother; Osteopenia in her mother.  ROS:   Please see the history of present illness.    ROS  All other systems reviewed and negative.   EKGs/Labs/Other Studies Reviewed:    The following studies were reviewed today: none  EKG:  EKG is  ordered today.  The ekg ordered today demonstrates sinus bradycardia at 59bpm with no ST changes  Recent Labs: 03/17/2017: ALT 17   Recent Lipid Panel    Component Value Date/Time   CHOL 127 06/19/2017 0751   TRIG 125 06/19/2017 0751   HDL 48 06/19/2017 0751   CHOLHDL 2.6 06/19/2017 0751   LDLCALC 54 06/19/2017 0751    Physical Exam:    VS:  BP 122/74   Pulse (!) 59   Ht 5' 7.5" (1.715 m)   Wt 180 lb  6.4 oz (81.8 kg)   BMI 27.84 kg/m     Wt Readings from Last 3 Encounters:  02/11/18 180 lb 6.4 oz (81.8 kg)  12/19/16 177 lb 6.4 oz (80.5 kg)  06/06/16 173 lb (78.5 kg)     GEN:  Well nourished, well developed in no acute distress HEENT: Normal NECK: No JVD; No carotid bruits LYMPHATICS: No lymphadenopathy CARDIAC: RRR, no murmurs, rubs, gallops RESPIRATORY:  Clear to auscultation without rales, wheezing or rhonchi  ABDOMEN: Soft, non-tender, non-distended MUSCULOSKELETAL:  No edema; No deformity  SKIN: Warm and dry NEUROLOGIC:  Alert and oriented x 3 PSYCHIATRIC:  Normal affect   ASSESSMENT:    1. Persistent atrial fibrillation (HCC)   2. PVC (premature ventricular contraction)   3. Coronary artery disease involving native coronary artery of native heart without angina pectoris    PLAN:    In order of problems listed above:  1.  Persistent atrial fibrillation -she remains in normal sinus rhythm.  She will continue on Lopressor 12.5 mg twice daily and Xarelto 20 mg daily for a CHADS2VASC score of 2.  I will check a BMET today.  Her hemoglobin was 13.3 on 10/16/2017.  2.  PVCs -these are well suppressed on metoprolol.  3.  ASCAD with 30% LAD by coronary CTA -she denies any anginal symptoms.   She is not on aspirin due to and NOAC.  Her last LDL was 54 on 06/19/2017.  She will continue on Lipitor 40 mg daily.   Medication Adjustments/Labs and Tests Ordered: Current medicines are reviewed at length with the patient today.  Concerns regarding medicines are outlined above.  No orders of the defined types were placed in this encounter.  No orders of the defined types were placed in this encounter.   Signed, Armanda Magic, MD  02/11/2018 9:13 AM    [facility]

## 2018-02-18 ENCOUNTER — Other Ambulatory Visit: Payer: Self-pay | Admitting: Cardiology

## 2018-02-19 ENCOUNTER — Other Ambulatory Visit: Payer: Self-pay | Admitting: Cardiology

## 2018-08-02 ENCOUNTER — Other Ambulatory Visit: Payer: Self-pay | Admitting: Family Medicine

## 2018-08-02 ENCOUNTER — Other Ambulatory Visit (HOSPITAL_COMMUNITY)
Admission: RE | Admit: 2018-08-02 | Discharge: 2018-08-02 | Disposition: A | Discharge status: Home / Self Care | Payer: BLUE CROSS/BLUE SHIELD | Source: Ambulatory Visit | Attending: Family Medicine | Admitting: Family Medicine

## 2018-08-02 DIAGNOSIS — Z124 Encounter for screening for malignant neoplasm of cervix: Secondary | ICD-10-CM | POA: Diagnosis not present

## 2018-08-04 LAB — CYTOLOGY - PAP
DIAGNOSIS: NEGATIVE
HPV (WINDOPATH): NOT DETECTED

## 2019-03-16 ENCOUNTER — Other Ambulatory Visit: Payer: Self-pay | Admitting: Cardiology

## 2019-03-16 MED ORDER — METOPROLOL TARTRATE 25 MG PO TABS: 12.5000 mg | ORAL_TABLET | Freq: Two times a day (BID) | ORAL | 0 refills | Status: DC

## 2019-03-22 ENCOUNTER — Telehealth: Payer: Self-pay | Admitting: Cardiology

## 2019-03-22 NOTE — Telephone Encounter (Signed)
Virtual Visit Pre-Appointment Phone Call  "(Name), I am calling you today to discuss your upcoming appointment. We are currently trying to limit exposure to the virus that causes COVID-19 by seeing patients at home rather than in the office."  1. "What is the BEST phone number to call the day of the visit?" - include this in appointment notes  2. Do you have or have access to (through a family member/friend) a smartphone with video capability that we can use for your visit?" a. If yes - list this number in appt notes as cell (if different from BEST phone #) and list the appointment type as a VIDEO visit in appointment notes b. If no - list the appointment type as a PHONE visit in appointment notes  3. Confirm consent - "In the setting of the current Covid19 crisis, you are scheduled for a (phone or video) visit with your provider on (date) at (time).  Just as we do with many in-office visits, in order for you to participate in this visit, we must obtain consent.  If you'd like, I can send this to your mychart (if signed up) or email for you to review.  Otherwise, I can obtain your verbal consent now.  All virtual visits are billed to your insurance company just like a normal visit would be.  By agreeing to a virtual visit, we'd like you to understand that the technology does not allow for your provider to perform an examination, and thus may limit your provider's ability to fully assess your condition. If your provider identifies any concerns that need to be evaluated in person, we will make arrangements to do so.  Finally, though the technology is pretty good, we cannot assure that it will always work on either your or our end, and in the setting of a video visit, we may have to convert it to a phone-only visit.  In either situation, we cannot ensure that we have a secure connection.  Are you willing to proceed?" STAFF: Did the patient verbally acknowledge consent to telehealth visit? Document  YES/NO here: YES  4. Advise patient to be prepared - "Two hours prior to your appointment, go ahead and check your blood pressure, pulse, oxygen saturation, and your weight (if you have the equipment to check those) and write them all down. When your visit starts, your provider will ask you for this information. If you have an Apple Watch or Kardia device, please plan to have heart rate information ready on the day of your appointment. Please have a pen and paper handy nearby the day of the visit as well."  5. Give patient instructions for MyChart download to smartphone OR Doximity/Doxy.me as below if video visit (depending on what platform provider is using)  6. Inform patient they will receive a phone call 15 minutes prior to their appointment time (may be from unknown caller ID) so they should be prepared to answer    TELEPHONE CALL NOTE  Julia Wilkerson has been deemed a candidate for a follow-up tele-health visit to limit community exposure during the Covid-19 pandemic. I spoke with the patient via phone to ensure availability of phone/video source, confirm preferred email & phone number, and discuss instructions and expectations.  I reminded Julia Wilkerson to be prepared with any vital sign and/or heart rhythm information that could potentially be obtained via home monitoring, at the time of her visit. I reminded Julia Wilkerson to expect a phone call prior to her visit.  Julia Wilkerson 03/22/2019 2:01 PM   INSTRUCTIONS FOR DOWNLOADING THE MYCHART APP TO SMARTPHONE  - The patient must first make sure to have activated MyChart and know their login information - If Apple, go to [employer] Store and type in MyChart in the search bar and download the app. If Android, ask patient to go to [employer] Play Store and type in [facility] in the search bar and download the app. The app is free but as with any other app downloads, their phone may require them to verify saved payment information or Apple/Android  password.  - The patient will need to then log into the app with their MyChart username and password, and select [place] as their healthcare provider to link the account. When it is time for your visit, go to the MyChart app, find appointments, and click Begin Video Visit. Be sure to Select Allow for your device to access the Microphone and Camera for your visit. You will then be connected, and your provider will be with you shortly.  **If they have any issues connecting, or need assistance please contact MyChart service desk [phone])**  **If using a computer, in order to ensure the best quality for their visit they will need to use either of the following Internet Browsers: [employer] Edge, or Google Chrome**  IF USING DOXIMITY or DOXY.ME - The patient will receive a link just prior to their visit by text.     FULL LENGTH CONSENT FOR TELE-HEALTH VISIT   I hereby voluntarily request, consent and authorize [facility] and its employed or contracted physicians, physician assistants, nurse practitioners or other licensed health care professionals (the Practitioner), to provide me with telemedicine health care services (the Services") as deemed necessary by the treating Practitioner. I acknowledge and consent to receive the Services by the Practitioner via telemedicine. I understand that the telemedicine visit will involve communicating with the Practitioner through live audiovisual communication technology and the disclosure of certain medical information by electronic transmission. I acknowledge that I have been given the opportunity to request an in-person assessment or other available alternative prior to the telemedicine visit and am voluntarily participating in the telemedicine visit.  I understand that I have the right to withhold or withdraw my consent to the use of telemedicine in the course of my care at any time, without affecting my right to future care or treatment,  and that the Practitioner or I may terminate the telemedicine visit at any time. I understand that I have the right to inspect all information obtained and/or recorded in the course of the telemedicine visit and may receive copies of available information for a reasonable fee.  I understand that some of the potential risks of receiving the Services via telemedicine include:   Delay or interruption in medical evaluation due to technological equipment failure or disruption;  Information transmitted may not be sufficient (e.g. poor resolution of images) to allow for appropriate medical decision making by the Practitioner; and/or   In rare instances, security protocols could fail, causing a breach of personal health information.  Furthermore, I acknowledge that it is my responsibility to provide information about my medical history, conditions and care that is complete and accurate to the best of my ability. I acknowledge that Practitioner's advice, recommendations, and/or decision may be based on factors not within their control, such as incomplete or inaccurate data provided by me or distortions of diagnostic images or specimens that may result from electronic transmissions. I understand that the  practice of medicine is not an Chief Strategy Officer and that Practitioner makes no warranties or guarantees regarding treatment outcomes. I acknowledge that I will receive a copy of this consent concurrently upon execution via email to the email address I last provided but may also request a printed copy by calling the office of [place].    I understand that my insurance will be billed for this visit.   I have read or had this consent read to me.  I understand the contents of this consent, which adequately explains the benefits and risks of the Services being provided via telemedicine.   I have been provided ample opportunity to ask questions regarding this consent and the Services and have had my questions  answered to my satisfaction.  I give my informed consent for the services to be provided through the use of telemedicine in my medical care  By participating in this telemedicine visit I agree to the above.

## 2019-03-23 DIAGNOSIS — E785 Hyperlipidemia, unspecified: Secondary | ICD-10-CM | POA: Insufficient documentation

## 2019-03-23 NOTE — Progress Notes (Signed)
Virtual Visit via Video Note   This visit type was conducted due to national recommendations for restrictions regarding the COVID-19 Pandemic (e.g. social distancing) in an effort to limit this patient's exposure and mitigate transmission in our community.  Due to her co-morbid illnesses, this patient is at least at moderate risk for complications without adequate follow up.  This format is felt to be most appropriate for this patient at this time.  All issues noted in this document were discussed and addressed.  A limited physical exam was performed with this format.  Please refer to the patient's chart for her consent to telehealth for [facility].   Evaluation Performed:  Follow-up visit  This visit type was conducted due to national recommendations for restrictions regarding the COVID-19 Pandemic (e.g. social distancing).  This format is felt to be most appropriate for this patient at this time.  All issues noted in this document were discussed and addressed.  No physical exam was performed (except for noted visual exam findings with Video Visits).  Please refer to the patient's chart (MyChart message for video visits and phone note for telephone visits) for the patient's consent to telehealth for [facility].  Date:  03/24/2019   ID:  Julia Wilkerson, DOB October 14, 1956, MRN [id number] PCP:  Laurann Montana, MD  Cardiologist:  Armanda Magic, MD Electrophysiologist:  None   Chief Complaint:  Atrial fibrillation and CAD  History of Present Illness:    Julia Wilkerson is a 63 y.o. female who presents via audio/video conferencing for a telehealth visit today.    Julia Wilkerson is a 62 y.o. female with a hx of persistentatrial fibrillation on Xareltofor a CHADS2VASC score of 2.She also has a history of SVT, PACs, PVCs and nonobstructive ASCAD with a 30% plaque in the LAD by coronary CTA.  She is here today for followup and is doing well.  She denies any chest pain or pressure, SOB, DOE, PND,  orthopnea, LE edema, dizziness, palpitations or syncope. She is compliant with her meds and is tolerating meds with no SE.    The patient does not have symptoms concerning for COVID-19 infection (fever, chills, cough, or new shortness of breath).    Prior CV studies:   The following studies were reviewed today:  none  Past Medical History:  Diagnosis Date  . CAD (coronary artery disease), native coronary artery    30% LAD by coronary CTA  . High cholesterol   . Persistent atrial fibrillation    CHADS2VASC score is 2 (female and CAD)  . PVC (premature ventricular contraction) 11/28/2015   Past Surgical History:  Procedure Laterality Date  . None       Current Meds  Medication Sig  . acetaminophen (TYLENOL) 500 MG tablet Take 500 mg by mouth every 6 (six) hours as needed for mild pain, fever or headache.  Marland Kitchen aspirin EC 81 MG tablet Take 81 mg by mouth daily.  Marland Kitchen atorvastatin (LIPITOR) 40 MG tablet Take 1 tablet (40 mg total) by mouth daily.  . methimazole (TAPAZOLE) 5 MG tablet Take 0.5 tablets by mouth daily.   . metoprolol tartrate (LOPRESSOR) 25 MG tablet Take 0.5 tablets (12.5 mg total) by mouth 2 (two) times daily. Please make overdue appt with Dr. Mayford Knife before anymore refills. 1st attempt     Allergies:   Lamisil [terbinafine]; Azo [phenazopyridine]; Penicillins; and Septra [sulfamethoxazole-trimethoprim]   Social History   Tobacco Use  . Smoking status: Never Smoker  . Smokeless tobacco: Never Used  Substance Use Topics  . Alcohol use: No    Alcohol/week: 0.0 standard drinks  . Drug use: No     Family Hx: The patient's family history includes Glaucoma in her mother; Heart attack in her mother; Hypothyroidism in her mother; Osteopenia in her mother.  ROS:   Please see the history of present illness.     All other systems reviewed and are negative.   Labs/Other Tests and Data Reviewed:    Recent Labs: No results found for requested labs within last 8760  hours.   Recent Lipid Panel Lab Results  Component Value Date/Time   CHOL 127 06/19/2017 07:51 AM   TRIG 125 06/19/2017 07:51 AM   HDL 48 06/19/2017 07:51 AM   CHOLHDL 2.6 06/19/2017 07:51 AM   LDLCALC 54 06/19/2017 07:51 AM    Wt Readings from Last 3 Encounters:  03/24/19 179 lb (81.2 kg)  02/11/18 180 lb 6.4 oz (81.8 kg)  12/19/16 177 lb 6.4 oz (80.5 kg)     Objective:    Vital Signs:  BP (!) 143/74   Pulse 69   Ht 5\' 7"  (1.702 m)   Wt 179 lb (81.2 kg)   BMI 28.04 kg/m    CONSTITUTIONAL:  Well nourished, well developed female in no acute distress.  EYES: anicteric MOUTH: oral mucosa is pink RESPIRATORY: Normal respiratory effort, symmetric expansion CARDIOVASCULAR: No peripheral edema SKIN: No rash, lesions or ulcers MUSCULOSKELETAL: no digital cyanosis NEURO: Cranial Nerves II-XII grossly intact, moves all extremities PSYCH: Intact judgement and insight.  A&O x 3, Mood/affect appropriate   ASSESSMENT & PLAN:    1.  ASCAD - nonobstructive ASCAD with a 30% plaque in the LAD by coronary CTA.  She has not had any anginal sx.   She will continue on ASA, BB and statin.   2.  Paroxysmal atrial fibrillation - she has not had any palpitations since I saw her last.  She thinks she is iNSR.  She will continue on  Lopressor 12.5mg  BID.  She decided to take herself off the Xarelto because of the cost and for now does not want to be on a blood thinner.  Her CHADS2VASC score is only 1 for female.  She has minimal CAD on coronary CTA.  I did talk to her though about when she turns 66 she will have a score of 2 and would likely recommend consideration of Eliquis going forward after that.  3.  Hyperlipidemia - -her LDL goal is <70.  She will continue on atorvastatin 40mg  daily.    4. COVID-19 Education:The signs and symptoms of COVID-19 were discussed with the patient and how to seek care for testing (follow up with PCP or arrange E-visit).  The importance of social distancing was  discussed today.  Patient Risk:   After full review of this patient's clinical status, I feel that they are at least moderate risk at this time.  Time:   Today, I have spent 10 minutes directly with the patient on video discussing medical problems including PAF, CAD, lipids.  We also reviewed the symptoms of COVID 19 and the ways to protect against contracting the virus with telehealth technology.  I spent an additional 5 minutes reviewing patient's chart including prior OV notes.  Medication Adjustments/Labs and Tests Ordered: Current medicines are reviewed at length with the patient today.  Concerns regarding medicines are outlined above.  Tests Ordered: No orders of the defined types were placed in this encounter.  Medication Changes: No orders  of the defined types were placed in this encounter.   Disposition:  Follow up in 6 month(s)  Signed, Armanda Magicraci Turner, MD  03/24/2019 8:17 AM    [facility]

## 2019-03-24 ENCOUNTER — Telehealth (INDEPENDENT_AMBULATORY_CARE_PROVIDER_SITE_OTHER): Payer: Self-pay | Admitting: Cardiology

## 2019-03-24 ENCOUNTER — Other Ambulatory Visit: Payer: Self-pay

## 2019-03-24 ENCOUNTER — Encounter: Payer: Self-pay | Admitting: Cardiology

## 2019-03-24 VITALS — BP 143/74 | HR 69 | Ht 67.0 in | Wt 179.0 lb

## 2019-03-24 DIAGNOSIS — E785 Hyperlipidemia, unspecified: Secondary | ICD-10-CM

## 2019-03-24 DIAGNOSIS — I2510 Atherosclerotic heart disease of native coronary artery without angina pectoris: Secondary | ICD-10-CM

## 2019-03-24 DIAGNOSIS — I4819 Other persistent atrial fibrillation: Secondary | ICD-10-CM

## 2019-03-24 DIAGNOSIS — Z7189 Other specified counseling: Secondary | ICD-10-CM

## 2019-03-24 MED ORDER — METOPROLOL TARTRATE 25 MG PO TABS: 12.5000 mg | ORAL_TABLET | Freq: Two times a day (BID) | ORAL | 3 refills | Status: DC

## 2019-03-24 NOTE — Patient Instructions (Signed)

## 2019-08-24 ENCOUNTER — Other Ambulatory Visit: Payer: Self-pay

## 2019-08-24 DIAGNOSIS — Z20822 Contact with and (suspected) exposure to covid-19: Secondary | ICD-10-CM

## 2019-08-25 LAB — NOVEL CORONAVIRUS, NAA: SARS-CoV-2, NAA: DETECTED — AB

## 2019-09-02 ENCOUNTER — Other Ambulatory Visit: Payer: Self-pay

## 2019-09-02 DIAGNOSIS — Z20822 Contact with and (suspected) exposure to covid-19: Secondary | ICD-10-CM

## 2019-09-04 LAB — NOVEL CORONAVIRUS, NAA: SARS-CoV-2, NAA: NOT DETECTED

## 2020-05-14 ENCOUNTER — Other Ambulatory Visit: Payer: Self-pay | Admitting: Cardiology

## 2020-06-22 ENCOUNTER — Telehealth: Payer: Self-pay | Admitting: Cardiology

## 2020-06-22 MED ORDER — METOPROLOL TARTRATE 25 MG PO TABS: 12.5000 mg | ORAL_TABLET | Freq: Two times a day (BID) | ORAL | 0 refills | Status: DC

## 2020-06-22 NOTE — Telephone Encounter (Signed)
Pt's medication was sent to pt's pharmacy as requested. Confirmation received.  °

## 2020-06-22 NOTE — Telephone Encounter (Signed)
*  STAT* If patient is at the pharmacy, call can be transferred to refill team.   1. Which medications need to be refilled? (please list name of each medication and dose if known) metoprolol tartrate (LOPRESSOR) 25 MG tablet  2. Which pharmacy/location (including street and city if local pharmacy) is medication to be sent to? [facility] [address] [place], [place] - [address]  3. Do they need a 30 day or 90 day supply? 90 day   Patient is scheduled 08/07/2020

## 2020-08-06 NOTE — Progress Notes (Addendum)
Date:  08/07/2020   ID:  Julia Wilkerson, DOB January 04, 1956, MRN [id number] PCP:  Laurann Montana, MD  Cardiologist:  Armanda Magic, MD Electrophysiologist:  None   Chief Complaint:  Atrial fibrillation and CAD  History of Present Illness:    Julia Wilkerson is a 64 y.o. female with a hx of persistentatrial fibrillation on Xareltofor a CHADS2VASC score of 2.She also has a history of SVT, PACs, PVCs and nonobstructive ASCAD with a 30% plaque in the LAD by coronary CTA.  SHE is here today for followup and is doing well.  sHE denies any chest pain or pressure, SOB, DOE (except when walking up a lot of stairs), PND, orthopnea, LE edema, dizziness (except for her vertigo), palpitations or syncope. sHE is compliant with her meds and is tolerating meds with no SE.    The patient does not have symptoms concerning for COVID-19 infection (fever, chills, cough, or new shortness of breath).    Prior CV studies:   The following studies were reviewed today:  EKG  Past Medical History:  Diagnosis Date  . CAD (coronary artery disease), native coronary artery    30% LAD by coronary CTA  . High cholesterol   . Persistent atrial fibrillation (HCC)    CHADS2VASC score is 2 (female and CAD)  . PVC (premature ventricular contraction) 11/28/2015   Past Surgical History:  Procedure Laterality Date  . None       Current Meds  Medication Sig  . acetaminophen (TYLENOL) 500 MG tablet Take 500 mg by mouth every 6 (six) hours as needed for mild pain, fever or headache.  . Ascorbic Acid (VITAMIN C) 1000 MG tablet Take 1,000 mg by mouth daily.  Marland Kitchen aspirin EC 81 MG tablet Take 81 mg by mouth daily.  Marland Kitchen atorvastatin (LIPITOR) 40 MG tablet Take 1 tablet (40 mg total) by mouth daily.  . Cholecalciferol (VITAMIN D3) 125 MCG (5000 UT) CAPS Take 1 capsule by mouth.  . Coenzyme Q10 (COQ10) 100 MG CAPS Take 100 mg by mouth daily.  . Magnesium 400 MG CAPS Take by mouth.  . methimazole (TAPAZOLE) 5 MG tablet Take 0.5  tablets by mouth daily.   . metoprolol tartrate (LOPRESSOR) 25 MG tablet Take 0.5 tablets (12.5 mg total) by mouth 2 (two) times daily. Please keep upcoming appt in September with Dr. Mayford Knife before anymore refills. Thank you  . vitamin A 3 MG (10000 UNITS) capsule Take 10,000 Units by mouth daily.  . Zinc 50 MG CAPS Take 50 mg by mouth daily.     Allergies:   Lamisil [terbinafine], Azo [phenazopyridine], Penicillins, and Septra [sulfamethoxazole-trimethoprim]   Social History   Tobacco Use  . Smoking status: Never Smoker  . Smokeless tobacco: Never Used  Vaping Use  . Vaping Use: Never used  Substance Use Topics  . Alcohol use: No    Alcohol/week: 0.0 standard drinks  . Drug use: No     Family Hx: The patient's family history includes Glaucoma in her mother; Heart attack in her mother; Hypothyroidism in her mother; Osteopenia in her mother.  ROS:   Please see the history of present illness.     All other systems reviewed and are negative.   Labs/Other Tests and Data Reviewed:    Recent Labs: No results found for requested labs within last 8760 hours.   Recent Lipid Panel Lab Results  Component Value Date/Time   CHOL 127 06/19/2017 07:51 AM   TRIG 125 06/19/2017 07:51 AM   HDL 48  06/19/2017 07:51 AM   CHOLHDL 2.6 06/19/2017 07:51 AM   LDLCALC 54 06/19/2017 07:51 AM    Wt Readings from Last 3 Encounters:  08/07/20 180 lb (81.6 kg)  03/24/19 179 lb (81.2 kg)  02/11/18 180 lb 6.4 oz (81.8 kg)     Objective:    Vital Signs:  BP 140/86   Pulse 66   Ht 5\' 7"  (1.702 m)   Wt 180 lb (81.6 kg)   SpO2 96%   BMI 28.19 kg/m    CONSTITUTIONAL:  Well nourished, well developed female in no acute distress.  EYES: anicteric MOUTH: oral mucosa is pink RESPIRATORY: Normal respiratory effort, symmetric expansion CARDIOVASCULAR: No peripheral edema SKIN: No rash, lesions or ulcers MUSCULOSKELETAL: no digital cyanosis NEURO: Cranial Nerves II-XII grossly intact, moves all  extremities PSYCH: Intact judgement and insight.  A&O x 3, Mood/affect appropriate  EKG was performed in office today and showed NSR with no ST changes  ASSESSMENT & PLAN:    1.  ASCAD  -nonobstructive ASCAD with a 30% plaque in the LAD by coronary CTA.   -she denies any anginal symptoms -continue ASA, BB and statin  2.  Paroxysmal atrial fibrillation  -she is maintaining NSR -denies any palpitations -continue Lopressor 12.5mg  BID  -Her CHADS2VASC score is only 1 for female.   -She has minimal CAD on coronary CTA.   -I did talk to her though about when she turns 66 she will have a score of 2 and would likely recommend consideration of Eliquis going forward after that.  3.  Hyperlipidemia  -her LDL goal is <70. -LDL was 84 in July -I will increase Lipitor to 80mg  daily -repeat FLp and ALT in 6 weeks  Medication Adjustments/Labs and Tests Ordered: Current medicines are reviewed at length with the patient today.  Concerns regarding medicines are outlined above.  Tests Ordered: Orders Placed This Encounter  Procedures  . EKG 12-Lead   Medication Changes: No orders of the defined types were placed in this encounter.   Disposition:  Follow up in 1 year  Signed, August, MD  08/07/2020 9:58 AM    [facility]

## 2020-08-07 ENCOUNTER — Ambulatory Visit (INDEPENDENT_AMBULATORY_CARE_PROVIDER_SITE_OTHER): Payer: 59 | Admitting: Cardiology

## 2020-08-07 ENCOUNTER — Other Ambulatory Visit: Payer: Self-pay

## 2020-08-07 ENCOUNTER — Encounter: Payer: Self-pay | Admitting: Cardiology

## 2020-08-07 VITALS — BP 140/86 | HR 66 | Ht 67.0 in | Wt 180.0 lb

## 2020-08-07 DIAGNOSIS — I4819 Other persistent atrial fibrillation: Secondary | ICD-10-CM

## 2020-08-07 DIAGNOSIS — I2510 Atherosclerotic heart disease of native coronary artery without angina pectoris: Secondary | ICD-10-CM | POA: Diagnosis not present

## 2020-08-07 DIAGNOSIS — E785 Hyperlipidemia, unspecified: Secondary | ICD-10-CM

## 2020-08-07 MED ORDER — ATORVASTATIN CALCIUM 80 MG PO TABS: 80.0000 mg | ORAL_TABLET | Freq: Every day | ORAL | 3 refills | Status: DC

## 2020-08-07 NOTE — Addendum Note (Signed)
Addended by: Theresia Majors on: 08/07/2020 10:06 AM   Modules accepted: Orders

## 2020-08-07 NOTE — Addendum Note (Signed)
Addended by: Theresia Majors on: 08/07/2020 10:09 AM   Modules accepted: Orders

## 2020-08-07 NOTE — Patient Instructions (Signed)
Medication Instructions:  Your physician has recommended you make the following change in your medication:  1) INCREASE Lipitor (atorvastatin) to 80 mg daily  *If you need a refill on your cardiac medications before your next appointment, please call your pharmacy*   Lab Work: Fasting lipids and ALT in 6 weeks.  If you have labs (blood work) drawn today and your tests are completely normal, you will receive your results only by: Marland Kitchen MyChart Message (if you have MyChart) OR . A paper copy in the mail If you have any lab test that is abnormal or we need to change your treatment, we will call you to review the results.  Follow-Up: At [facility], you and your health needs are our priority.  As part of our continuing mission to provide you with exceptional heart care, we have created designated Provider Care Teams.  These Care Teams include your primary Cardiologist (physician) and Advanced Practice Providers (APPs -  Physician Assistants and Nurse Practitioners) who all work together to provide you with the care you need, when you need it.  Your next appointment:   1 year(s)  The format for your next appointment:   In Person  Provider:   You may see Armanda Magic, MD or one of the following Advanced Practice Providers on your designated Care Team:    Ronie Spies, PA-C  Jacolyn Reedy, PA-C

## 2020-09-14 ENCOUNTER — Telehealth: Payer: Self-pay | Admitting: Cardiology

## 2020-09-14 NOTE — Telephone Encounter (Signed)
Spoke with the patient who states that she has not increased her atorvastatin. She continues to take 40 mg daily. She would like to work on her diet and she has started exercising. She has cancelled her lab appointment and would like to wait until next year to recheck labs.

## 2020-09-14 NOTE — Telephone Encounter (Signed)
Pt c/o medication issue:  1. Name of Medication: atorvastatin (LIPITOR) 80 MG tablet  2. How are you currently taking this medication (dosage and times per day)? Not taking  3. Are you having a reaction (difficulty breathing--STAT)? no  4. What is your medication issue? Patient states she has not increased the dose of medication. She states she has been taking the 40 mg and trying to exercise instead. She would like to know if she still needs to have the lab work done 09/19/20

## 2020-09-19 ENCOUNTER — Other Ambulatory Visit: Payer: 59

## 2020-10-08 ENCOUNTER — Other Ambulatory Visit: Payer: Self-pay

## 2020-10-08 MED ORDER — METOPROLOL TARTRATE 25 MG PO TABS: 12.5000 mg | ORAL_TABLET | Freq: Two times a day (BID) | ORAL | 2 refills | Status: DC

## 2020-11-27 ENCOUNTER — Other Ambulatory Visit: Payer: 59

## 2020-12-28 ENCOUNTER — Other Ambulatory Visit: Payer: 59 | Admitting: *Deleted

## 2020-12-28 ENCOUNTER — Other Ambulatory Visit: Payer: Self-pay

## 2020-12-28 DIAGNOSIS — E785 Hyperlipidemia, unspecified: Secondary | ICD-10-CM

## 2020-12-28 LAB — LIPID PANEL
Chol/HDL Ratio: 2.5 ratio (ref 0.0–4.4)
Cholesterol, Total: 162 mg/dL (ref 100–199)
HDL: 64 mg/dL (ref >39)
LDL Chol Calc (NIH): 80 mg/dL (ref 0–99)
Triglycerides: 101 mg/dL (ref 0–149)
VLDL Cholesterol Cal: 18 mg/dL (ref 5–40)

## 2020-12-28 LAB — ALT: ALT: 30 IU/L (ref 0–32)

## 2021-01-01 ENCOUNTER — Telehealth: Payer: Self-pay

## 2021-01-01 NOTE — Telephone Encounter (Signed)
The patient has been notified of the result and verbalized understanding.  All questions (if any) were answered. Theresia Majors, RN 01/01/2021 11:50 AM  Patient did not increase atorvastatin. She is currently taking 40 mg daily.

## 2021-01-01 NOTE — Telephone Encounter (Signed)
-----   Message from Quintella Reichert, MD sent at 12/29/2020  7:43 PM EST ----- LDL not really improved after increasing atorvastatin.  PLease verify that patient increased dose and find out if she missed any doses

## 2021-05-13 ENCOUNTER — Other Ambulatory Visit: Payer: Self-pay

## 2021-05-13 ENCOUNTER — Ambulatory Visit (INDEPENDENT_AMBULATORY_CARE_PROVIDER_SITE_OTHER): Payer: 59 | Admitting: Otolaryngology

## 2021-05-13 DIAGNOSIS — H9041 Sensorineural hearing loss, unilateral, right ear, with unrestricted hearing on the contralateral side: Secondary | ICD-10-CM | POA: Diagnosis not present

## 2021-05-13 NOTE — Progress Notes (Signed)
HPI: Julia Wilkerson is a 65 y.o. female who presents for evaluation of severe hearing loss in her right ear.  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  She gradually lost hearing in the right ear.  He presently is not having any vertigo problems or balance problems but cannot hear out of the right ear.  She had an MRI scan performed in 2008 that did not demonstrate any cochlear or retrocochlear pathology. She had a recent audiogram at [facility] that demonstrated hearing in the right ear to be approximately 70 dB with hearing in the left ear better than 20 dB.  Past Medical History:  Diagnosis Date   CAD (coronary artery disease), native coronary artery    30% LAD by coronary CTA   High cholesterol    Persistent atrial fibrillation (HCC)    CHADS2VASC score is 2 (female and CAD)   PVC (premature ventricular contraction) 11/28/2015   Past Surgical History:  Procedure Laterality Date   None     Social History   Socioeconomic History   Marital status: Married    Spouse name: Not on file   Number of children: Not on file   Years of education: Not on file   Highest education level: Not on file  Occupational History   Occupation: Not employed  Tobacco Use   Smoking status: Never   Smokeless tobacco: Never  Vaping Use   Vaping Use: Never used  Substance and Sexual Activity   Alcohol use: No    Alcohol/week: 0.0 standard drinks   Drug use: No   Sexual activity: Yes  Other Topics Concern   Not on file  Social History Narrative   Lives with husband and her mother (cares for her mother).   Social Determinants of Health   Financial Resource Strain: Not on file  Food Insecurity: Not on file  Transportation Needs: Not on file  Physical Activity: Not on file  Stress: Not on file  Social Connections: Not on file   Family History  Problem Relation Age of Onset   Glaucoma Mother    Heart attack Mother        MI in her 32s   Osteopenia Mother     Hypothyroidism Mother    Allergies  Allergen Reactions   Lamisil [Terbinafine] Other (See Comments)    REACTION: Lost taste    Azo [Phenazopyridine] Hives and Rash   Penicillins Hives   Septra [Sulfamethoxazole-Trimethoprim] Hives   Prior to Admission medications   Medication Sig Start Date End Date Taking? Authorizing Provider  acetaminophen (TYLENOL) 500 MG tablet Take 500 mg by mouth every 6 (six) hours as needed for mild pain, fever or headache.    [provider]  Ascorbic Acid (VITAMIN C) 1000 MG tablet Take 1,000 mg by mouth daily.    [provider]  aspirin EC 81 MG tablet Take 81 mg by mouth daily.    [provider]  atorvastatin (LIPITOR) 80 MG tablet Take 1 tablet (80 mg total) by mouth daily. Patient taking differently: Take 40 mg by mouth daily. 08/07/20   Quintella Reichert, MD  Cholecalciferol (VITAMIN D3) 125 MCG (5000 UT) CAPS Take 1 capsule by mouth.    [provider]  Coenzyme Q10 (COQ10) 100 MG CAPS Take 100 mg by mouth daily.    [provider]  Magnesium 400 MG CAPS Take by mouth.    [provider]  methimazole (TAPAZOLE) 5 MG tablet Take 0.5 tablets by  mouth daily.  02/08/19   [provider]  metoprolol tartrate (LOPRESSOR) 25 MG tablet Take 0.5 tablets (12.5 mg total) by mouth 2 (two) times daily. 10/08/20   Quintella Reichert, MD  vitamin A 3 MG (10000 UNITS) capsule Take 10,000 Units by mouth daily.    [provider]  Zinc 50 MG CAPS Take 50 mg by mouth daily.    [provider]     Positive ROS: Otherwise negative  All other systems have been reviewed and were otherwise negative with the exception of those mentioned in the HPI and as above.  Physical Exam: Constitutional: Alert, well-appearing, no acute distress Ears: External ears without lesions or tenderness. Ear canals are clear bilaterally with intact, clear TMs bilaterally. Nasal: External nose without lesions.. Clear  nasal passages Oral: Lips and gums without lesions. Tongue and palate mucosa without lesions. Posterior oropharynx clear. Neck: No palpable adenopathy or masses Respiratory: Breathing comfortably  Skin: No facial/neck lesions or rash noted.  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  There were no discrimination scores noted on her recent hearing test from [employer].  She might benefit from use of hearing aid in the right ear as far as localization of sound.  It may be beneficial but would recommend trying 1 initially for couple weeks to see if it helps at all. She is cleared for hearing aid use.  Narda Bonds, MD

## 2021-07-23 ENCOUNTER — Other Ambulatory Visit: Payer: Self-pay

## 2021-07-23 MED ORDER — METOPROLOL TARTRATE 25 MG PO TABS: 12.5000 mg | ORAL_TABLET | Freq: Two times a day (BID) | ORAL | 0 refills | Status: DC

## 2021-08-19 ENCOUNTER — Other Ambulatory Visit: Payer: Self-pay | Admitting: Cardiology

## 2021-10-21 ENCOUNTER — Encounter: Payer: Self-pay | Admitting: Physician Assistant

## 2021-10-21 NOTE — Progress Notes (Addendum)
Cardiology Office Note    Date:  10/22/2021   ID:  Julia Wilkerson, DOB [birth year], MRN [id number]  PCP:  Laurann Montana, MD  Cardiologist:  Armanda Magic, MD  Electrophysiologist:  None   Chief Complaint: f/u atrial fib, ectopy  History of Present Illness:   Julia Wilkerson (pronounced e-bahn) is a 65 y.o. female with history of paroxysmal atrial fibrillation, SVT, NSVT, PACs, PVCs, nonobstructive CAD, HLD, hyperthyroidism. She was originally diagnosed with atrial fib in 2016 in the context of hyperthyroidism. 2D echo 2016 showed normal EF 55-60% otherwise no significant findings. She was started on anticoagulation. DCCV was initially deferred while undergoing treatment for thyroid disease. F/u event monitor 06/2015 showed average HR 80bpm, range 60-127bpm, occasional PACs (2.73%)/PVCs (1.42%), 2 runs NSVT up to 21 beats, and 2 runs of atrial tach up to 24 beats, no recurrent atrial fibrillation. She was previously on anticoagulation but stopped this as she did not wish to take this. NST in 11/2015 showed normal perfusion but abnormal EKG response. This prompted coronary CTA showing only mild CAD 30% mLAD. She did not wish to titrate her statin. This did also demonstrate incidental pulmonary nodules on the report, felt benign but with recommendation to consider f/u scan.  She is seen back for follow-up feeling well without any cardiac complaints. No CP, SOB, palpitations, dizziness, syncope. She follows with Dr. [name] for her thyroid disease. At time of AF in 2016 she does not recall having palpitations; it was moreso SOB. She has not had any recurrence that reminds her of this.   Labwork independently reviewed: 07/2021 [facility] Tchol 149, HDL 59, LDL 76, trig 66, Hgb 13.3, K 4.5, Cr 0.70, ALT wnl 12/2020 ALT 30, LDL 80, trig 101, HDL 64 11/2020 [facility] TSH wnl 68/1157 Hgb 13.2, Plt 175, K 3.8, Cr 0.72, AST/ALT wnl, LDL 75, trig 107, TSH wnl  Past Medical History:  Diagnosis Date   Atrial tachycardia  (HCC)    CAD (coronary artery disease), native coronary artery    30% LAD by coronary CTA   High cholesterol    Hyperthyroidism    NSVT (nonsustained ventricular tachycardia)    Paroxysmal atrial fibrillation (HCC)    CHADS2VASC score is 2 (female and CAD)   Premature atrial contractions    PVC (premature ventricular contraction) 11/28/2015    Past Surgical History:  Procedure Laterality Date   None      Current Medications: Current Meds  Medication Sig   acetaminophen (TYLENOL) 500 MG tablet Take 500 mg by mouth every 6 (six) hours as needed for mild pain, fever or headache.   Ascorbic Acid (VITAMIN C) 1000 MG tablet Take 1,000 mg by mouth daily.   aspirin EC 81 MG tablet Take 81 mg by mouth daily.   atorvastatin (LIPITOR) 40 MG tablet Take 40 mg by mouth daily.   Cholecalciferol (VITAMIN D3) 125 MCG (5000 UT) CAPS Take 1 capsule by mouth.   Coenzyme Q10 (COQ10) 100 MG CAPS Take 100 mg by mouth daily.   Magnesium 400 MG CAPS Take by mouth.   methimazole (TAPAZOLE) 5 MG tablet Take 0.5 tablets by mouth daily.    metoprolol tartrate (LOPRESSOR) 25 MG tablet take half tablet by mouth twice a day **please make appointment with dr Mayford Knife before anymore refills**   vitamin A 3 MG (10000 UNITS) capsule Take 10,000 Units by mouth daily.   Zinc 50 MG CAPS Take 50 mg by mouth daily.   [DISCONTINUED] atorvastatin (LIPITOR) 80 MG tablet Take 1  tablet (80 mg total) by mouth daily. (Patient taking differently: Take 40 mg by mouth daily.)    Allergies:   Lamisil [terbinafine], Azo [phenazopyridine], Penicillins, and Septra [sulfamethoxazole-trimethoprim]   Social History   Socioeconomic History   Marital status: Married    Spouse name: Not on file   Number of children: Not on file   Years of education: Not on file   Highest education level: Not on file  Occupational History   Occupation: Not employed  Tobacco Use   Smoking status: Never   Smokeless tobacco: Never  Vaping Use    Vaping Use: Never used  Substance and Sexual Activity   Alcohol use: No    Alcohol/week: 0.0 standard drinks   Drug use: No   Sexual activity: Yes  Other Topics Concern   Not on file  Social History Narrative   Lives with husband and her mother (cares for her mother).   Social Determinants of Health   Financial Resource Strain: Not on file  Food Insecurity: Not on file  Transportation Needs: Not on file  Physical Activity: Not on file  Stress: Not on file  Social Connections: Not on file     Family History:  The patient's family history includes Glaucoma in her mother; Heart attack in her mother; Hypothyroidism in her mother; Osteopenia in her mother.  ROS:   Please see the history of present illness.  All other systems are reviewed and otherwise negative.    EKGs/Labs/Other Studies Reviewed:    Studies reviewed are outlined and summarized above. Reports included below if pertinent.  2D Echo 06/2015 - Left ventricle: The cavity size was normal. Wall thickness was    normal. Systolic function was normal. The estimated ejection    fraction was in the range of 55% to 60%.   Event monitor 06/2015 Normal sinus rhythm with average herat rate 80bpm. The heart rate ranged from 60-127bpm. Sinus tachycardia up to 127bpm. Occasional PACs. Occasional PVCs. Nonsustained ventricular tachycardia up to 21 beats Nonsustained atrial tachycardia up to 24bpm  NST 11/2015 Nuclear stress EF: 59%. The study is normal. This is a low risk study. The left ventricular ejection fraction is normal (55-65%).   Normal resting and stress perfusion. However patient has significant chest pain with stress and positive ECG response Consider f/u cardiac CTA or or other testing to define anatomy further   Calcium score CT 12/2015  ADDENDUM REPORT: 12/25/2015 12:40 CLINICAL DATA:  Risk stratification EXAM: Coronary Calcium Score TECHNIQUE: The patient was scanned on a Siemens Sensation 16 slice  scanner. Axial non-contrast 3 mm slices were carried out through the heart. The data set was analyzed on a dedicated work station and scored using the Agatson method. FINDINGS: Non-cardiac: See separate report from [facility]. Ascending Aorta:  Normal size. Pericardium: Normal. Coronary arteries:  Normal origin.  Right dominance. IMPRESSION: Coronary calcium score of 77. This was 90 percentile for age and sex matched control. Tobias Alexander Electronically Signed   By: Tobias Alexander   On: 12/25/2015 12:40  FINDINGS: Non-cardiac: See separate report from [facility]. No significant findings on limited lung and soft tissue windows.   Calcium Score:  96 Two dense areas of calcification in mid LAD   Coronary Arteries: Right dominant with no anomalies   LM: Normal   LAD: Less than 30% calcific lesion in mid LAD just after D1 take off   D1: Normal   D2: Normal   Circumflex: Normal   OM1: Normal  RCA:  Dominant and normal   PDA: Normal   PLA:  Normal   IMPRESSION: 1) Right dominant coronary arteries with no obstructive disease Less than 30% calcified lesion in mid LAD   2) Calcium score 96 two lesions in mid LAD 91st percentile for age and sex matched controls   Charlton Haws   Electronically Signed: By: Charlton Haws M.D. On: 01/15/2016 10:39     IMPRESSION: Incidental extracardiac CT findings are unchanged since 12/25/2015. Scattered subpleural pulmonary nodules, largest 6 mm in the left lower lobe, all unchanged since 06/18/2015, in keeping with 6 month stability, probably benign. If the patient is at high risk for bronchogenic carcinoma, follow-up chest CT at 6-12 months is recommended. If the patient is at low risk for bronchogenic carcinoma, follow-up chest CT at 12 months is recommended. This recommendation follows the consensus statement: Guidelines for Management of Small Pulmonary Nodules Detected on CT Scans: A Statement  from the Fleischner Society as published in Radiology 2005;237:395-400. Electronically Signed   By: Delbert Phenix M.D.   On: 01/15/2016 11:22        EKG:  EKG is ordered today, personally reviewed, demonstrating NSR 67bpm, nonspecific ST changes V5-V6 similar to prior  Recent Labs: 12/28/2020: ALT 30  Recent Lipid Panel    Component Value Date/Time   CHOL 162 12/28/2020 0901   TRIG 101 12/28/2020 0901   HDL 64 12/28/2020 0901   CHOLHDL 2.5 12/28/2020 0901   LDLCALC 80 12/28/2020 0901    PHYSICAL EXAM:    VS:  BP 120/80 (BP Location: Left Arm, Patient Position: Sitting, Cuff Size: Normal)   Pulse 67   Ht 5\' 7"  (1.702 m)   Wt 182 lb (82.6 kg)   SpO2 96%   BMI 28.51 kg/m   BMI: Body mass index is 28.51 kg/m.  GEN: Well nourished, well developed female in no acute distress HEENT: normocephalic, atraumatic Neck: no JVD, carotid bruits, or masses Cardiac: RRR; no murmurs, rubs, or gallops, no edema  Respiratory:  clear to auscultation bilaterally, normal work of breathing GI: soft, nontender, nondistended, + BS MS: no deformity or atrophy Skin: warm and dry, no rash Neuro:  Alert and Oriented x 3, Strength and sensation are intact, follows commands Psych: euthymic mood, full affect  Wt Readings from Last 3 Encounters:  10/22/21 182 lb (82.6 kg)  08/07/20 180 lb (81.6 kg)  03/24/19 179 lb (81.2 kg)     ASSESSMENT & PLAN:   1. Mild CAD - asymptomatic. Continue risk factor modification. She is maintained on a baby aspirin, statin, and beta blocker.  2. Paroxysmal atrial fibrillation, PACs/PVCs/SVT/NSVT by prior event monitor - no recent symptoms to suggest arrhythmia. Atrial fib was initially recognized in the context of hyperthyroidism. She has not had recent clinical recurrence. CHADSVASC is 2 for female, coronary atherosclerosis. She also turns 66 next year which will raise the score to 3. I discussed recommendation for anticoagulation including stroke risk. She  declines use of anticoagulant at this time. She was encouraged to reach out if she reconsiders. We also discussed consideration of using a smartwatch to monitor her HR. Continue metoprolol. She will notify for any new cardiac symptoms.  3. Hyperlipidemia - Last LDL 76, recommended goal <70. She does not wish to titrate atorvastatin. She will work on diet/exercise. She will continue atorvastatin 40mg  daily.  4. Pulmonary nodules - incidental finding on prior coronary CTA. Discussed repeating chest CT through our office vs her discussing the finding with primary  care. She requests the latter. I outlined the finding on her AVS so she may relay this to her PCP. A copy of this note will also be automatically faxed as well.    Disposition: F/u with Dr. Mayford Knife or myself in 1 year, sooner if needed.  Medication Adjustments/Labs and Tests Ordered: Current medicines are reviewed at length with the patient today.  Concerns regarding medicines are outlined above. Medication changes, Labs and Tests ordered today are summarized above and listed in the Patient Instructions accessible in Encounters.   Signed, Laurann Montana, PA-C  10/22/2021 11:29 AM    [facility] Phone: [phone]; Fax: [phone]

## 2021-10-22 ENCOUNTER — Other Ambulatory Visit: Payer: Self-pay

## 2021-10-22 ENCOUNTER — Encounter: Payer: Self-pay | Admitting: Physician Assistant

## 2021-10-22 ENCOUNTER — Ambulatory Visit (INDEPENDENT_AMBULATORY_CARE_PROVIDER_SITE_OTHER): Payer: 59 | Admitting: Physician Assistant

## 2021-10-22 VITALS — BP 120/80 | HR 67 | Ht 67.0 in | Wt 182.0 lb

## 2021-10-22 DIAGNOSIS — I480 Paroxysmal atrial fibrillation: Secondary | ICD-10-CM

## 2021-10-22 DIAGNOSIS — E785 Hyperlipidemia, unspecified: Secondary | ICD-10-CM

## 2021-10-22 DIAGNOSIS — R918 Other nonspecific abnormal finding of lung field: Secondary | ICD-10-CM

## 2021-10-22 DIAGNOSIS — I2510 Atherosclerotic heart disease of native coronary artery without angina pectoris: Secondary | ICD-10-CM

## 2021-10-22 DIAGNOSIS — I4729 Other ventricular tachycardia: Secondary | ICD-10-CM

## 2021-10-22 DIAGNOSIS — I471 Supraventricular tachycardia: Secondary | ICD-10-CM

## 2021-10-22 DIAGNOSIS — I493 Ventricular premature depolarization: Secondary | ICD-10-CM

## 2021-10-22 DIAGNOSIS — I491 Atrial premature depolarization: Secondary | ICD-10-CM

## 2021-10-22 NOTE — Patient Instructions (Addendum)
Medication Instructions:  Your physician recommends that you continue on your current medications as directed. Please refer to the Current Medication list given to you today.  *If you need a refill on your cardiac medications before your next appointment, please call your pharmacy*   Lab Work: None ordered  If you have labs (blood work) drawn today and your tests are completely normal, you will receive your results only by: MyChart Message (if you have MyChart) OR A paper copy in the mail If you have any lab test that is abnormal or we need to change your treatment, we will call you to review the results.   Testing/Procedures: None ordered   Follow-Up: At [facility], you and your health needs are our priority.  As part of our continuing mission to provide you with exceptional heart care, we have created designated Provider Care Teams.  These Care Teams include your primary Cardiologist (physician) and Advanced Practice Providers (APPs -  Physician Assistants and Nurse Practitioners) who all work together to provide you with the care you need, when you need it.  We recommend signing up for the patient portal called "MyChart".  Sign up information is provided on this After Visit Summary.  MyChart is used to connect with patients for Virtual Visits (Telemedicine).  Patients are able to view lab/test results, encounter notes, upcoming appointments, etc.  Non-urgent messages can be sent to your provider as well.   To learn more about what you can do with MyChart, go to [email or web address].    Your next appointment:   12 month(s)  The format for your next appointment:   In Person  Provider:   Armanda Magic, MD  or Ronie Spies, PA-C         Other Instructions  Your previous coronary CT scan incidentally picked up lung nodules. These are very common incidental findings. The report stated, "Incidental extracardiac CT findings are unchanged since 12/25/2015. Scattered subpleural  pulmonary nodules, largest 6 mm in the left lower lobe, all unchanged since 06/18/2015, in keeping with 6 month stability, probably benign." It did suggest having a repeat CT scan in the future to reassess. Please speak with your primary care provider about this.

## 2021-10-25 ENCOUNTER — Ambulatory Visit: Payer: 59 | Admitting: Cardiology

## 2021-11-13 ENCOUNTER — Other Ambulatory Visit: Payer: Self-pay | Admitting: Cardiology

## 2022-04-24 DIAGNOSIS — H0102B Squamous blepharitis left eye, upper and lower eyelids: Secondary | ICD-10-CM | POA: Diagnosis not present

## 2022-04-24 DIAGNOSIS — H0520 Unspecified exophthalmos: Secondary | ICD-10-CM | POA: Diagnosis not present

## 2022-04-24 DIAGNOSIS — H04123 Dry eye syndrome of bilateral lacrimal glands: Secondary | ICD-10-CM | POA: Diagnosis not present

## 2022-04-24 DIAGNOSIS — E0500 Thyrotoxicosis with diffuse goiter without thyrotoxic crisis or storm: Secondary | ICD-10-CM | POA: Diagnosis not present

## 2022-04-24 DIAGNOSIS — H0102A Squamous blepharitis right eye, upper and lower eyelids: Secondary | ICD-10-CM | POA: Diagnosis not present

## 2022-05-05 DIAGNOSIS — E0500 Thyrotoxicosis with diffuse goiter without thyrotoxic crisis or storm: Secondary | ICD-10-CM | POA: Diagnosis not present

## 2022-05-05 DIAGNOSIS — E0590 Thyrotoxicosis, unspecified without thyrotoxic crisis or storm: Secondary | ICD-10-CM | POA: Diagnosis not present

## 2022-05-25 DIAGNOSIS — N3001 Acute cystitis with hematuria: Secondary | ICD-10-CM | POA: Diagnosis not present

## 2022-08-18 DIAGNOSIS — E0590 Thyrotoxicosis, unspecified without thyrotoxic crisis or storm: Secondary | ICD-10-CM | POA: Diagnosis not present

## 2022-08-18 DIAGNOSIS — Z0000 Encounter for general adult medical examination without abnormal findings: Secondary | ICD-10-CM | POA: Diagnosis not present

## 2022-08-18 DIAGNOSIS — M8588 Other specified disorders of bone density and structure, other site: Secondary | ICD-10-CM | POA: Diagnosis not present

## 2022-08-18 DIAGNOSIS — I2510 Atherosclerotic heart disease of native coronary artery without angina pectoris: Secondary | ICD-10-CM | POA: Diagnosis not present

## 2022-08-18 DIAGNOSIS — Z1159 Encounter for screening for other viral diseases: Secondary | ICD-10-CM | POA: Diagnosis not present

## 2022-08-18 DIAGNOSIS — I4891 Unspecified atrial fibrillation: Secondary | ICD-10-CM | POA: Diagnosis not present

## 2022-08-18 DIAGNOSIS — H8101 Meniere's disease, right ear: Secondary | ICD-10-CM | POA: Diagnosis not present

## 2022-08-18 DIAGNOSIS — Z1211 Encounter for screening for malignant neoplasm of colon: Secondary | ICD-10-CM | POA: Diagnosis not present

## 2022-08-18 DIAGNOSIS — E0500 Thyrotoxicosis with diffuse goiter without thyrotoxic crisis or storm: Secondary | ICD-10-CM | POA: Diagnosis not present

## 2022-08-18 DIAGNOSIS — E785 Hyperlipidemia, unspecified: Secondary | ICD-10-CM | POA: Diagnosis not present

## 2022-08-19 ENCOUNTER — Other Ambulatory Visit: Payer: Self-pay | Admitting: Family Medicine

## 2022-08-19 DIAGNOSIS — Z1231 Encounter for screening mammogram for malignant neoplasm of breast: Secondary | ICD-10-CM

## 2022-08-19 DIAGNOSIS — M8580 Other specified disorders of bone density and structure, unspecified site: Secondary | ICD-10-CM

## 2022-09-07 DIAGNOSIS — L03317 Cellulitis of buttock: Secondary | ICD-10-CM | POA: Diagnosis not present

## 2022-09-09 DIAGNOSIS — L0231 Cutaneous abscess of buttock: Secondary | ICD-10-CM | POA: Diagnosis not present

## 2022-10-14 ENCOUNTER — Ambulatory Visit: Payer: Self-pay

## 2022-10-18 DIAGNOSIS — M79672 Pain in left foot: Secondary | ICD-10-CM | POA: Diagnosis not present

## 2022-10-18 DIAGNOSIS — M7752 Other enthesopathy of left foot: Secondary | ICD-10-CM | POA: Diagnosis not present

## 2022-10-21 NOTE — Progress Notes (Unsigned)
Cardiology Office Note    Date:  10/23/2022   ID:  Julia Wilkerson, DOB 25-Dec-1955, MRN [id number]  PCP:  Laurann Montana, MD  Cardiologist:  Armanda Magic, MD  Electrophysiologist:  None   Chief Complaint: f/u afib  History of Present Illness:   Julia Wilkerson (pronounced e-bahn) is a 66 y.o. female with history of paroxysmal atrial fibrillation, SVT, NSVT, PACs, PVCs, nonobstructive CAD, HLD, hyperthyroidism, pulmonary nodules.  She was originally diagnosed with atrial fib in 2016 in the context of hyperthyroidism. 2D echo 2016 showed normal EF 55-60% otherwise no significant findings. She was started on anticoagulation. DCCV was initially deferred while undergoing treatment for thyroid disease. F/u event monitor 06/2015 showed average HR 80bpm, range 60-127bpm, occasional PACs (2.73%)/PVCs (1.42%), 2 runs NSVT up to 21 beats, and 2 runs of atrial tach up to 24 beats, no recurrent atrial fibrillation. She was previously on anticoagulation but stopped this as she did not wish to take this any further and was counseled on stroke risk. NST in 11/2015 showed normal perfusion but abnormal EKG response. This prompted coronary CTA showing only mild CAD 30% mLAD. She did not wish to titrate her statin. This did also demonstrate incidental pulmonary nodules on the report, felt benign but with recommendation to consider f/u scan. At last OV, she was advised to discuss follow-up with her PCP.   She is seen for follow-up today overall doing well without any new chest pain, SOB, palpitations, syncope, or concerns. She denies any interim updates from a thyroid standpoint. We revisited the recommendation for anticoagulation. She continues to decline this.CHADSVASC is 3. She has not yet had her CT repeated.   Labwork independently reviewed: 08/2022 BUN 9, Cr 0.670, LDL 79, trig 128, TSH wnl, K 4.2 07/2021 KPN Tchol 149, HDL 59, LDL 76, trig 66, Hgb 13.3, K 4.5, Cr 0.70, ALT wnl 11/2020 KPN TSH wnl  Cardiology  Studies:   Studies reviewed are outlined and summarized above. Reports included below if pertinent.   Echo 2016  Study Conclusions   - Left ventricle: The cavity size was normal. Wall thickness was    normal. Systolic function was normal. The estimated ejection    fraction was in the range of 55% to 60%.      Past Medical History:  Diagnosis Date   Atrial tachycardia    CAD (coronary artery disease), native coronary artery    30% LAD by coronary CTA   High cholesterol    Hyperthyroidism    NSVT (nonsustained ventricular tachycardia) (HCC)    Paroxysmal atrial fibrillation (HCC)    CHADS2VASC score is 2 (female and CAD)   Premature atrial contractions    PVC (premature ventricular contraction) 11/28/2015    Past Surgical History:  Procedure Laterality Date   None      Current Medications: Current Meds  Medication Sig   acetaminophen (TYLENOL) 500 MG tablet Take 500 mg by mouth every 6 (six) hours as needed for mild pain, fever or headache.   Ascorbic Acid (VITAMIN C) 1000 MG tablet Take 1,000 mg by mouth daily.   aspirin EC 81 MG tablet Take 81 mg by mouth daily.   atorvastatin (LIPITOR) 40 MG tablet Take 40 mg by mouth daily.   Cholecalciferol (VITAMIN D3) 125 MCG (5000 UT) CAPS Take 1 capsule by mouth.   Coenzyme Q10 (COQ10) 100 MG CAPS Take 100 mg by mouth daily.   Magnesium 400 MG CAPS Take by mouth.   methimazole (TAPAZOLE) 5 MG tablet Take  0.5 tablets by mouth daily.    metoprolol tartrate (LOPRESSOR) 25 MG tablet Take 0.5 tablets (12.5 mg total) by mouth 2 (two) times daily.   vitamin A 3 MG (10000 UNITS) capsule Take 10,000 Units by mouth daily.   Zinc 50 MG CAPS Take 50 mg by mouth daily.     Allergies:   Lamisil [terbinafine], Azo [phenazopyridine], Penicillins, and Septra [sulfamethoxazole-trimethoprim]   Social History   Socioeconomic History   Marital status: Married    Spouse name: Not on file   Number of children: Not on file   Years of  education: Not on file   Highest education level: Not on file  Occupational History   Occupation: Not employed  Tobacco Use   Smoking status: Never   Smokeless tobacco: Never  Vaping Use   Vaping Use: Never used  Substance and Sexual Activity   Alcohol use: No    Alcohol/week: 0.0 standard drinks of alcohol   Drug use: No   Sexual activity: Yes  Other Topics Concern   Not on file  Social History Narrative   Lives with husband and her mother (cares for her mother).   Social Determinants of Health   Financial Resource Strain: Not on file  Food Insecurity: Not on file  Transportation Needs: Not on file  Physical Activity: Not on file  Stress: Not on file  Social Connections: Not on file     Family History:  The patient's family history includes Glaucoma in her mother; Heart attack in her mother; Hypothyroidism in her mother; Osteopenia in her mother.  ROS:   Please see the history of present illness.  All other systems are reviewed and otherwise negative.    EKG(s)/Additional Labs   EKG:  EKG is ordered today, personally reviewed, demonstrating sinus bradycardia 56bpm, nonspecific early ST upsloping inferiorly, V5-V6. Similar to prior. Stable.  Recent Labs: No results found for requested labs within last 365 days.  Recent Lipid Panel    Component Value Date/Time   CHOL 162 12/28/2020 0901   TRIG 101 12/28/2020 0901   HDL 64 12/28/2020 0901   CHOLHDL 2.5 12/28/2020 0901   LDLCALC 80 12/28/2020 0901    PHYSICAL EXAM:    VS:  BP 118/70   Pulse (!) 56   Ht 5\' 7"  (1.702 m)   Wt 176 lb (79.8 kg)   SpO2 99%   BMI 27.57 kg/m   BMI: Body mass index is 27.57 kg/m.  GEN: Well nourished, well developed female in no acute distress HEENT: normocephalic, atraumatic Neck: no JVD, carotid bruits, or masses Cardiac: RRR; no murmurs, rubs, or gallops, no edema  Respiratory:  clear to auscultation bilaterally, normal work of breathing GI: soft, nontender, nondistended, +  BS MS: no deformity or atrophy Skin: warm and dry, no rash Neuro:  Alert and Oriented x 3, Strength and sensation are intact, follows commands Psych: euthymic mood, full affect  Wt Readings from Last 3 Encounters:  10/23/22 176 lb (79.8 kg)  10/22/21 182 lb (82.6 kg)  08/07/20 180 lb (81.6 kg)     ASSESSMENT & PLAN:   1. Mild CAD - doing well without accelerating angina. Continue ASA, metoprolol, and atorvastatin.   2. PAF, also hx of PACs/PVCs/SVT/NSVT  - quiescent on metoprolol. Continue present dose. Will refill today. Revisited recommendation for anticoagulation due to risk of stroke. She continues to decline at this time.  3. Hyperlipidemia - this has been followed by primary care. Discussed goal LDL <70 given LDL 79  and recommended titration of atorvastatin. She does not wish to change this medicine at this time.  4. Pulmonary nodules - we discussed this last year, with option for our team to repeat versus primary care. She had planned to discuss with PCP but has not yet do so. She is agreeable to going forward with the scan. We will schedule her for non-contrasted CT of the chest.    Disposition: F/u with Dr. Mayford Knife or myself in 1 year.   Medication Adjustments/Labs and Tests Ordered: Current medicines are reviewed at length with the patient today.  Concerns regarding medicines are outlined above. Medication changes, Labs and Tests ordered today are summarized above and listed in the Patient Instructions accessible in Encounters.   Signed, Laurann Montana, PA-C  10/23/2022 9:21 AM    [facility] Phone: [phone]; Fax: [phone]

## 2022-10-23 ENCOUNTER — Encounter: Payer: Self-pay | Admitting: Physician Assistant

## 2022-10-23 ENCOUNTER — Ambulatory Visit: Payer: Medicare HMO | Attending: Physician Assistant | Admitting: Physician Assistant

## 2022-10-23 VITALS — BP 118/70 | HR 56 | Ht 67.0 in | Wt 176.0 lb

## 2022-10-23 DIAGNOSIS — I2510 Atherosclerotic heart disease of native coronary artery without angina pectoris: Secondary | ICD-10-CM

## 2022-10-23 DIAGNOSIS — R918 Other nonspecific abnormal finding of lung field: Secondary | ICD-10-CM | POA: Diagnosis not present

## 2022-10-23 DIAGNOSIS — I480 Paroxysmal atrial fibrillation: Secondary | ICD-10-CM | POA: Diagnosis not present

## 2022-10-23 DIAGNOSIS — E785 Hyperlipidemia, unspecified: Secondary | ICD-10-CM

## 2022-10-23 MED ORDER — METOPROLOL TARTRATE 25 MG PO TABS: 12.5000 mg | ORAL_TABLET | Freq: Two times a day (BID) | ORAL | 3 refills | Status: DC

## 2022-10-23 NOTE — Patient Instructions (Addendum)
Medication Instructions:   Your physician recommends that you continue on your current medications as directed. Please refer to the Current Medication list given to you today.  *If you need a refill on your cardiac medications before your next appointment, please call your pharmacy*   Lab Work: NONE ORDERED  TODAY   If you have labs (blood work) drawn today and your tests are completely normal, you will receive your results only by: MyChart Message (if you have MyChart) OR A paper copy in the mail If you have any lab test that is abnormal or we need to change your treatment, we will call you to review the results.   Testing/Procedures: Non-Cardiac CT scanning, (CAT scanning), is a noninvasive, special x-ray that produces cross-sectional images of the body using x-rays and a computer. CT scans help physicians diagnose and treat medical conditions. For some CT exams, a contrast material is used to enhance visibility in the area of the body being studied. CT scans provide greater clarity and reveal more details than regular x-ray exams.     Follow-Up: At [facility], you and your health needs are our priority.  As part of our continuing mission to provide you with exceptional heart care, we have created designated Provider Care Teams.  These Care Teams include your primary Cardiologist (physician) and Advanced Practice Providers (APPs -  Physician Assistants and Nurse Practitioners) who all work together to provide you with the care you need, when you need it.  We recommend signing up for the patient portal called "MyChart".  Sign up information is provided on this After Visit Summary.  MyChart is used to connect with patients for Virtual Visits (Telemedicine).  Patients are able to view lab/test results, encounter notes, upcoming appointments, etc.  Non-urgent messages can be sent to your provider as well.   To learn more about what you can do with MyChart, go to  [email or web address].    Your next appointment:   1 year(s)  The format for your next appointment:   In Person  Provider:   Ronie Spies, PA-C OR , Armanda Magic, MD   Other Instructions   Important Information About Sugar

## 2022-10-23 NOTE — Progress Notes (Deleted)
Cardiology Office Note    Date:  10/23/2022   ID:  Julia Wilkerson, DOB April 22, 1956, MRN [id number]  PCP:  Laurann Montana, MD  Cardiologist:  Armanda Magic, MD  Electrophysiologist:  None   Chief Complaint: ***  History of Present Illness:   Julia Wilkerson is a 66 y.o. female with history of ***  Labwork independently reviewed:    Cardiology Studies:   Studies reviewed are outlined and summarized above. Reports included below if pertinent.   ***    Past Medical History:  Diagnosis Date   Atrial tachycardia (HCC)    CAD (coronary artery disease), native coronary artery    30% LAD by coronary CTA   High cholesterol    Hyperthyroidism    NSVT (nonsustained ventricular tachycardia)    Paroxysmal atrial fibrillation (HCC)    CHADS2VASC score is 2 (female and CAD)   Premature atrial contractions    PVC (premature ventricular contraction) 11/28/2015    Past Surgical History:  Procedure Laterality Date   None      Current Medications: No outpatient medications have been marked as taking for the 10/23/22 encounter (Appointment) with Laurann Montana, PA-C.   ***   Allergies:   Lamisil [terbinafine], Azo [phenazopyridine], Penicillins, and Septra [sulfamethoxazole-trimethoprim]   Social History   Socioeconomic History   Marital status: Married    Spouse name: Not on file   Number of children: Not on file   Years of education: Not on file   Highest education level: Not on file  Occupational History   Occupation: Not employed  Tobacco Use   Smoking status: Never   Smokeless tobacco: Never  Vaping Use   Vaping Use: Never used  Substance and Sexual Activity   Alcohol use: No    Alcohol/week: 0.0 standard drinks of alcohol   Drug use: No   Sexual activity: Yes  Other Topics Concern   Not on file  Social History Narrative   Lives with husband and her mother (cares for her mother).   Social Determinants of Health   Financial Resource Strain: Not on file  Food  Insecurity: Not on file  Transportation Needs: Not on file  Physical Activity: Not on file  Stress: Not on file  Social Connections: Not on file     Family History:  The patient's ***family history includes Glaucoma in her mother; Heart attack in her mother; Hypothyroidism in her mother; Osteopenia in her mother.  ROS:   Please see the history of present illness. Otherwise, review of systems is positive for ***.  All other systems are reviewed and otherwise negative.    EKG(s)/Additional Labs   EKG:  EKG is ordered today, personally reviewed, demonstrating ***  Recent Labs: No results found for requested labs within last 365 days.  Recent Lipid Panel    Component Value Date/Time   CHOL 162 12/28/2020 0901   TRIG 101 12/28/2020 0901   HDL 64 12/28/2020 0901   CHOLHDL 2.5 12/28/2020 0901   LDLCALC 80 12/28/2020 0901    PHYSICAL EXAM:    VS:  There were no vitals taken for this visit.  BMI: There is no height or weight on file to calculate BMI.  GEN: Well nourished, well developed female in no acute distress HEENT: normocephalic, atraumatic Neck: no JVD, carotid bruits, or masses Cardiac: ***RRR; no murmurs, rubs, or gallops, no edema  Respiratory:  clear to auscultation bilaterally, normal work of breathing GI: soft, nontender, nondistended, + BS MS: no deformity or atrophy Skin:  warm and dry, no rash Neuro:  Alert and Oriented x 3, Strength and sensation are intact, follows commands Psych: euthymic mood, full affect  Wt Readings from Last 3 Encounters:  10/22/21 182 lb (82.6 kg)  08/07/20 180 lb (81.6 kg)  03/24/19 179 lb (81.2 kg)     ASSESSMENT & PLAN:   ***     Disposition: F/u with ***   Medication Adjustments/Labs and Tests Ordered: Current medicines are reviewed at length with the patient today.  Concerns regarding medicines are outlined above. Medication changes, Labs and Tests ordered today are summarized above and listed in the Patient  Instructions accessible in Encounters.   Signed, Laurann Montana, PA-C  10/23/2022 7:51 AM    [facility] Phone: [phone]; Fax: [phone]

## 2022-10-31 ENCOUNTER — Ambulatory Visit (HOSPITAL_BASED_OUTPATIENT_CLINIC_OR_DEPARTMENT_OTHER)
Admission: RE | Admit: 2022-10-31 | Discharge: 2022-10-31 | Disposition: A | Discharge status: Home / Self Care | Payer: Medicare HMO | Source: Ambulatory Visit | Attending: Physician Assistant | Admitting: Physician Assistant

## 2022-10-31 DIAGNOSIS — R918 Other nonspecific abnormal finding of lung field: Secondary | ICD-10-CM | POA: Diagnosis not present

## 2022-11-03 DIAGNOSIS — H9041 Sensorineural hearing loss, unilateral, right ear, with unrestricted hearing on the contralateral side: Secondary | ICD-10-CM | POA: Diagnosis not present

## 2022-11-04 DIAGNOSIS — H9041 Sensorineural hearing loss, unilateral, right ear, with unrestricted hearing on the contralateral side: Secondary | ICD-10-CM | POA: Diagnosis not present

## 2022-11-24 ENCOUNTER — Ambulatory Visit
Admission: RE | Admit: 2022-11-24 | Discharge: 2022-11-24 | Disposition: A | Discharge status: Home / Self Care | Payer: Medicare HMO | Source: Ambulatory Visit | Attending: Family Medicine | Admitting: Family Medicine

## 2022-11-24 DIAGNOSIS — Z1231 Encounter for screening mammogram for malignant neoplasm of breast: Secondary | ICD-10-CM | POA: Diagnosis not present

## 2022-12-11 ENCOUNTER — Telehealth: Payer: Self-pay | Admitting: Cardiology

## 2022-12-11 MED ORDER — METOPROLOL TARTRATE 25 MG PO TABS: 12.5000 mg | ORAL_TABLET | Freq: Two times a day (BID) | ORAL | 3 refills | Status: DC

## 2022-12-11 NOTE — Telephone Encounter (Signed)
Refill for Metoprolol has been sent to [facility].

## 2022-12-11 NOTE — Telephone Encounter (Signed)
*  STAT* If patient is at the pharmacy, call can be transferred to refill team.   1. Which medications need to be refilled? (please list name of each medication and dose if known)  metoprolol tartrate (LOPRESSOR) 25 MG tablet   2. Which pharmacy/location (including street and city if local pharmacy) is medication to be sent to? [facility] # [place], [place]   3. Do they need a 30 day or 90 day supply? [place]

## 2023-01-02 DIAGNOSIS — E0590 Thyrotoxicosis, unspecified without thyrotoxic crisis or storm: Secondary | ICD-10-CM | POA: Diagnosis not present

## 2023-01-02 DIAGNOSIS — E0500 Thyrotoxicosis with diffuse goiter without thyrotoxic crisis or storm: Secondary | ICD-10-CM | POA: Diagnosis not present

## 2023-02-19 DIAGNOSIS — E0500 Thyrotoxicosis with diffuse goiter without thyrotoxic crisis or storm: Secondary | ICD-10-CM | POA: Diagnosis not present

## 2023-02-19 DIAGNOSIS — E0590 Thyrotoxicosis, unspecified without thyrotoxic crisis or storm: Secondary | ICD-10-CM | POA: Diagnosis not present

## 2023-03-23 DIAGNOSIS — E0590 Thyrotoxicosis, unspecified without thyrotoxic crisis or storm: Secondary | ICD-10-CM | POA: Diagnosis not present

## 2023-03-30 DIAGNOSIS — E0500 Thyrotoxicosis with diffuse goiter without thyrotoxic crisis or storm: Secondary | ICD-10-CM | POA: Diagnosis not present

## 2023-04-01 ENCOUNTER — Ambulatory Visit
Admission: RE | Admit: 2023-04-01 | Discharge: 2023-04-01 | Disposition: A | Discharge status: Home / Self Care | Payer: Self-pay | Source: Ambulatory Visit | Attending: Family Medicine | Admitting: Family Medicine

## 2023-04-01 DIAGNOSIS — M8580 Other specified disorders of bone density and structure, unspecified site: Secondary | ICD-10-CM

## 2023-04-01 DIAGNOSIS — Z13820 Encounter for screening for osteoporosis: Secondary | ICD-10-CM | POA: Diagnosis not present

## 2023-04-27 DIAGNOSIS — H0520 Unspecified exophthalmos: Secondary | ICD-10-CM | POA: Diagnosis not present

## 2023-04-27 DIAGNOSIS — H04123 Dry eye syndrome of bilateral lacrimal glands: Secondary | ICD-10-CM | POA: Diagnosis not present

## 2023-04-27 DIAGNOSIS — H0102B Squamous blepharitis left eye, upper and lower eyelids: Secondary | ICD-10-CM | POA: Diagnosis not present

## 2023-04-27 DIAGNOSIS — E0500 Thyrotoxicosis with diffuse goiter without thyrotoxic crisis or storm: Secondary | ICD-10-CM | POA: Diagnosis not present

## 2023-04-27 DIAGNOSIS — H0102A Squamous blepharitis right eye, upper and lower eyelids: Secondary | ICD-10-CM | POA: Diagnosis not present

## 2023-09-02 DIAGNOSIS — E785 Hyperlipidemia, unspecified: Secondary | ICD-10-CM | POA: Diagnosis not present

## 2023-09-02 DIAGNOSIS — E0500 Thyrotoxicosis with diffuse goiter without thyrotoxic crisis or storm: Secondary | ICD-10-CM | POA: Diagnosis not present

## 2023-09-02 DIAGNOSIS — M8588 Other specified disorders of bone density and structure, other site: Secondary | ICD-10-CM | POA: Diagnosis not present

## 2023-09-04 DIAGNOSIS — Z1211 Encounter for screening for malignant neoplasm of colon: Secondary | ICD-10-CM | POA: Diagnosis not present

## 2023-09-14 DIAGNOSIS — E0590 Thyrotoxicosis, unspecified without thyrotoxic crisis or storm: Secondary | ICD-10-CM | POA: Diagnosis not present

## 2023-10-02 DIAGNOSIS — E0590 Thyrotoxicosis, unspecified without thyrotoxic crisis or storm: Secondary | ICD-10-CM | POA: Diagnosis not present

## 2023-10-06 DIAGNOSIS — E0500 Thyrotoxicosis with diffuse goiter without thyrotoxic crisis or storm: Secondary | ICD-10-CM | POA: Diagnosis not present

## 2023-10-06 DIAGNOSIS — E0590 Thyrotoxicosis, unspecified without thyrotoxic crisis or storm: Secondary | ICD-10-CM | POA: Diagnosis not present

## 2023-11-06 DIAGNOSIS — E0590 Thyrotoxicosis, unspecified without thyrotoxic crisis or storm: Secondary | ICD-10-CM | POA: Diagnosis not present

## 2023-11-24 ENCOUNTER — Ambulatory Visit: Payer: Medicare HMO | Admitting: Physician Assistant

## 2023-12-03 ENCOUNTER — Ambulatory Visit: Payer: Medicare HMO | Admitting: Cardiology

## 2023-12-11 DIAGNOSIS — E0590 Thyrotoxicosis, unspecified without thyrotoxic crisis or storm: Secondary | ICD-10-CM | POA: Diagnosis not present

## 2023-12-24 ENCOUNTER — Telehealth: Payer: Self-pay | Admitting: Cardiology

## 2023-12-24 MED ORDER — METOPROLOL TARTRATE 25 MG PO TABS: 12.5000 mg | ORAL_TABLET | Freq: Two times a day (BID) | ORAL | 0 refills | Status: DC

## 2023-12-24 NOTE — Telephone Encounter (Signed)
*  STAT* If patient is at the pharmacy, call can be transferred to refill team.   1. Which medications need to be refilled? (please list name of each medication and dose if known)  metoprolol  tartrate (LOPRESSOR ) 25 MG tablet  2. Which pharmacy/location (including street and city if local pharmacy) is medication to be sent to? [id number] - [place], [place] - [address]   3. Do they need a 30 day or 90 day supply? 90

## 2023-12-28 ENCOUNTER — Ambulatory Visit: Payer: Medicare HMO | Admitting: Cardiology

## 2024-01-05 DIAGNOSIS — E0590 Thyrotoxicosis, unspecified without thyrotoxic crisis or storm: Secondary | ICD-10-CM | POA: Diagnosis not present

## 2024-01-08 DIAGNOSIS — E0500 Thyrotoxicosis with diffuse goiter without thyrotoxic crisis or storm: Secondary | ICD-10-CM | POA: Diagnosis not present

## 2024-01-08 DIAGNOSIS — E0590 Thyrotoxicosis, unspecified without thyrotoxic crisis or storm: Secondary | ICD-10-CM | POA: Diagnosis not present

## 2024-02-08 DIAGNOSIS — E0590 Thyrotoxicosis, unspecified without thyrotoxic crisis or storm: Secondary | ICD-10-CM | POA: Diagnosis not present

## 2024-02-22 ENCOUNTER — Ambulatory Visit: Payer: Medicare HMO | Admitting: Cardiology

## 2024-02-22 ENCOUNTER — Ambulatory Visit: Attending: Cardiology | Admitting: Cardiology

## 2024-02-22 ENCOUNTER — Encounter: Payer: Self-pay | Admitting: Cardiology

## 2024-02-22 VITALS — BP 126/84 | HR 65 | Ht 67.0 in | Wt 183.0 lb

## 2024-02-22 DIAGNOSIS — E785 Hyperlipidemia, unspecified: Secondary | ICD-10-CM

## 2024-02-22 DIAGNOSIS — I480 Paroxysmal atrial fibrillation: Secondary | ICD-10-CM

## 2024-02-22 DIAGNOSIS — I2510 Atherosclerotic heart disease of native coronary artery without angina pectoris: Secondary | ICD-10-CM | POA: Diagnosis not present

## 2024-02-22 NOTE — Progress Notes (Signed)
 Date:  02/22/2024   ID:  Julia Wilkerson, DOB 14-Oct-1956, MRN [id number] PCP:  Laurann Montana, MD  Cardiologist:  Armanda Magic, MD Electrophysiologist:  None   Chief Complaint:  Atrial fibrillation and CAD  History of Present Illness:    Julia Wilkerson is a 68 y.o. female with a hx of persistent atrial fibrillation on Xarelto for a CHADS2VASC score of 2.  She also has a history of SVT, PACs, PVCs and nonobstructive ASCAD with a 30% plaque in the LAD by coronary CTA.  She also has a hx of hyperthyroidism and has been following with Dr. [name].  She is on Methimazole  Her thyroid has been out of wack the past year and while her meds have been adjusted she has had some palpitations.  She is here today for followup and is doing well.  She denies any chest pain or pressure, SOB, DOE, PND, orthopnea, LE edema or syncope. She has some problems with vertigo from time to time from her Meneire's .  She is compliant with her meds and is tolerating meds with no SE.    Prior CV studies:   The following studies were reviewed today:   Past Medical History:  Diagnosis Date   Atrial tachycardia (HCC)    CAD (coronary artery disease), native coronary artery    30% LAD by coronary CTA   High cholesterol    Hyperthyroidism    NSVT (nonsustained ventricular tachycardia) (HCC)    Paroxysmal atrial fibrillation (HCC)    CHADS2VASC score is 2 (female and CAD)   Premature atrial contractions    PVC (premature ventricular contraction) 11/28/2015   Past Surgical History:  Procedure Laterality Date   None       Current Meds  Medication Sig   acetaminophen (TYLENOL) 500 MG tablet Take 500 mg by mouth every 6 (six) hours as needed for mild pain, fever or headache.   Ascorbic Acid (VITAMIN C) 1000 MG tablet Take 1,000 mg by mouth daily.   aspirin EC 81 MG tablet Take 81 mg by mouth daily.   Cholecalciferol (VITAMIN D3) 125 MCG (5000 UT) CAPS Take 1 capsule by mouth.   Coenzyme Q10 (COQ10) 100 MG CAPS Take  100 mg by mouth daily.   Cyanocobalamin (B-12) 5000 MCG CAPS Take 5,000 mcg by mouth daily.   Magnesium 400 MG CAPS Take by mouth.   methimazole (TAPAZOLE) 10 MG tablet Take 10 mg by mouth daily. (Patient taking differently: Take 10 mg by mouth daily.)   metoprolol tartrate (LOPRESSOR) 25 MG tablet Take 0.5 tablets (12.5 mg total) by mouth 2 (two) times daily.   vitamin A 3 MG (10000 UNITS) capsule Take 10,000 Units by mouth daily.   Zinc 50 MG CAPS Take 50 mg by mouth daily.     Allergies:   Lamisil [terbinafine], Azo [phenazopyridine], Penicillins, and Septra [sulfamethoxazole-trimethoprim]   Social History   Tobacco Use   Smoking status: Never   Smokeless tobacco: Never  Vaping Use   Vaping status: Never Used  Substance Use Topics   Alcohol use: No    Alcohol/week: 0.0 standard drinks of alcohol   Drug use: No     Family Hx: The patient's family history includes Glaucoma in her mother; Heart attack in her mother; Hypothyroidism in her mother; Osteopenia in her mother.  ROS:   Please see the history of present illness.     All other systems reviewed and are negative.   Labs/Other Tests and Data Reviewed:  EKG Interpretation Date/Time:  Monday February 22 2024 14:53:35 EDT Ventricular Rate:  65 PR Interval:  156 QRS Duration:  74 QT Interval:  404 QTC Calculation: 420 R Axis:   40  Text Interpretation: Normal sinus rhythm Normal ECG When compared with ECG of 19-Jun-2015 05:53, Sinus rhythm has replaced Atrial fibrillation Confirmed by Armanda Magic ([id number]) on 02/22/2024 3:13:11 PM    Recent Labs: No results found for requested labs within last 365 days.   Recent Lipid Panel Lab Results  Component Value Date/Time   CHOL 162 12/28/2020 09:01 AM   TRIG 101 12/28/2020 09:01 AM   HDL 64 12/28/2020 09:01 AM   CHOLHDL 2.5 12/28/2020 09:01 AM   LDLCALC 80 12/28/2020 09:01 AM    Wt Readings from Last 3 Encounters:  02/22/24 183 lb (83 kg)  10/23/22 176 lb (79.8 kg)   10/22/21 182 lb (82.6 kg)     Objective:    Vital Signs:  BP 126/84   Pulse 65   Ht 5\' 7"  (1.702 m)   Wt 183 lb (83 kg)   SpO2 96%   BMI 28.66 kg/m    GEN: Well nourished, well developed in no acute distress HEENT: Normal NECK: No JVD; No carotid bruits LYMPHATICS: No lymphadenopathy CARDIAC:RRR, no murmurs, rubs, gallops RESPIRATORY:  Clear to auscultation without rales, wheezing or rhonchi  ABDOMEN: Soft, non-tender, non-distended MUSCULOSKELETAL:  No edema; No deformity  SKIN: Warm and dry NEUROLOGIC:  Alert and oriented x 3 PSYCHIATRIC:  Normal affect  ASSESSMENT & PLAN:    1.  ASCAD  -nonobstructive ASCAD with a 30% plaque in the LAD by coronary CTA.   -She has not had any chest pain or shortness of breath since I saw her last. -Continue drug management with aspirin 81 mg daily, Lopressor 12.5 mg twice daily with as needed refills -she was taking Atorvastatin but stopped a month ago due to achiness.  She thinks the achiness has improved.  2.  Paroxysmal atrial fibrillation  -She remains in normal sinus rhythm and denies any recent palpitations -Her CHADS2VASC score had only been 1 for female but now is 3 as she is turned 19 -She has minimal CAD on coronary CTA.   -Recommend starting Eliquis 5 mg twice daily but she does want to do it at this time>>I strongly encouraged her to consider it and she will think about it -Continue prescription drug management with Lopressor 12.5 mg twice daily with as needed refills  3.  Hyperlipidemia  -her LDL goal is <70. -she was on Atorvastatin 40mg  daily but had to stop due to joint and muscle aches -her muscle aches stopped so I will refer her to lipid clinic for consideration of PCSK9i   Medication Adjustments/Labs and Tests Ordered: Current medicines are reviewed at length with the patient today.  Concerns regarding medicines are outlined above.  Tests Ordered: Orders Placed This Encounter  Procedures   EKG 12-Lead    Medication Changes: No orders of the defined types were placed in this encounter.   Disposition:  Follow up in 1 year  Signed, Armanda Magic, MD  02/22/2024 3:13 PM    [facility]

## 2024-02-22 NOTE — Addendum Note (Signed)
 Addended by: Erick Alley on: 02/22/2024 03:22 PM   Modules accepted: Orders

## 2024-02-22 NOTE — Patient Instructions (Signed)
 Medication Instructions:  Your physician recommends that you continue on your current medications as directed. Please refer to the Current Medication list given to you today.  *If you need a refill on your cardiac medications before your next appointment, please call your pharmacy*  Lab Work: NONE If you have labs (blood work) drawn today and your tests are completely normal, you will receive your results only by: MyChart Message (if you have MyChart) OR A paper copy in the mail If you have any lab test that is abnormal or we need to change your treatment, we will call you to review the results.  Testing/Procedures: NONE  Follow-Up: At [facility], you and your health needs are our priority.  As part of our continuing mission to provide you with exceptional heart care, our providers are all part of one team.  This team includes your primary Cardiologist (physician) and Advanced Practice Providers or APPs (Physician Assistants and Nurse Practitioners) who all work together to provide you with the care you need, when you need it.  Your next appointment:   1 year(s)  Provider:   Armanda Magic, MD     We recommend signing up for the patient portal called "MyChart".  Sign up information is provided on this After Visit Summary.  MyChart is used to connect with patients for Virtual Visits (Telemedicine).  Patients are able to view lab/test results, encounter notes, upcoming appointments, etc.  Non-urgent messages can be sent to your provider as well.   To learn more about what you can do with MyChart, go to [email or web address].   Other Instructions You have been referred to our Pharmacist for lipid management. Someone will be reaching out to schedule an appointment.      1st Floor: - Lobby - Registration  - Pharmacy  - Lab - Cafe  2nd Floor: - PV Lab - Diagnostic Testing (echo, CT, nuclear med)  3rd Floor: - Vacant  4th Floor: - TCTS (cardiothoracic surgery) -  AFib Clinic - Structural Heart Clinic - Vascular Surgery  - Vascular Ultrasound  5th Floor: - HeartCare Cardiology (general and EP) - Clinical Pharmacy for coumadin, hypertension, lipid, weight-loss medications, and med management appointments    Valet parking services will be available as well.

## 2024-03-08 DIAGNOSIS — E0590 Thyrotoxicosis, unspecified without thyrotoxic crisis or storm: Secondary | ICD-10-CM | POA: Diagnosis not present

## 2024-03-09 DIAGNOSIS — N3000 Acute cystitis without hematuria: Secondary | ICD-10-CM | POA: Diagnosis not present

## 2024-03-09 DIAGNOSIS — R300 Dysuria: Secondary | ICD-10-CM | POA: Diagnosis not present

## 2024-03-26 ENCOUNTER — Other Ambulatory Visit: Payer: Self-pay | Admitting: Cardiology

## 2024-04-12 DIAGNOSIS — E0590 Thyrotoxicosis, unspecified without thyrotoxic crisis or storm: Secondary | ICD-10-CM | POA: Diagnosis not present

## 2024-04-22 ENCOUNTER — Ambulatory Visit: Attending: Cardiology | Admitting: Pharmacist

## 2024-04-22 DIAGNOSIS — E785 Hyperlipidemia, unspecified: Secondary | ICD-10-CM | POA: Diagnosis not present

## 2024-04-22 NOTE — Progress Notes (Signed)
 Patient ID: Julia Wilkerson                 DOB: 10-10-1956                    MRN: [id number]      HPI: Julia Wilkerson is a 68 y.o. female patient referred to lipid clinic by Dr. Micael Adas. 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    Patient had been on atorvastatin  for several years.  Recently stopped atorvastatin  due to achiness.  She was referred to Pharm.D. clinic to discuss options.  Reviewed options for lowering LDL cholesterol, including ezetimibe, PCSK-9 inhibitors, bempedoic acid, inclisiran for rosuvastatin or pravastatin.  Discussed mechanisms of action, dosing, side effects and potential decreases in LDL cholesterol.  Also reviewed cost information and potential options for patient assistance.   Current Medications: None Intolerances: atorvastatin  40mg  daily (achiness in hands and arms) Risk Factors: Coronary calcium  score that placed her in the 91st percentile, age LDL-C goal: Less than 70 ApoB goal: Less than 80  Diet:  Breakfast: smoothies (fruit, yogurt, tumeric,black pepper) or egg wrap Lunch: rice and vegetables chicken (avocado oil/olive oil) Dinner: vegetables w/ meat Snack: nuts, fruit, sometimes chips Drink: coffee, water  Exercise: trying to do exercises everyday - indoor walking app- ~ 5 days a week 20 min  Family History:  Family History  Problem Relation Age of Onset   Glaucoma Mother    Heart attack Mother        MI in her 37s   Osteopenia Mother    Hypothyroidism Mother     Social History: no tobocco, no ETOH  Labs: Lipid Panel     Component Value Date/Time   CHOL 162 12/28/2020 0901   TRIG 101 12/28/2020 0901   HDL 64 12/28/2020 0901   CHOLHDL 2.5 12/28/2020 0901   LDLCALC 80 12/28/2020 0901   LABVLDL 18 12/28/2020 0901    Past Medical History:  Diagnosis Date   Atrial  tachycardia (HCC)    CAD (coronary artery disease), native coronary artery    30% LAD by coronary CTA   High cholesterol    Hyperthyroidism    NSVT (nonsustained ventricular tachycardia) (HCC)    Paroxysmal atrial fibrillation (HCC)    CHADS2VASC score is 2 (female and CAD)   Premature atrial contractions    PVC (premature ventricular contraction) 11/28/2015    Current Outpatient Medications on File Prior to Visit  Medication Sig Dispense Refill   acetaminophen  (TYLENOL ) 500 MG tablet Take 500 mg by mouth every 6 (six) hours as needed for mild pain, fever or headache.     Ascorbic Acid (VITAMIN C) 1000 MG tablet Take 1,000 mg by mouth daily.     aspirin  EC 81 MG tablet Take 81 mg by mouth daily.     atorvastatin  (LIPITOR) 40 MG tablet Take 40 mg by mouth daily. (Patient not taking: Reported on 02/22/2024)     Cholecalciferol (VITAMIN D3) 125 MCG (5000 UT) CAPS Take 1 capsule by mouth.     Coenzyme Q10 (COQ10) 100 MG CAPS Take 100 mg by mouth daily.     Cyanocobalamin (B-12) 5000 MCG CAPS Take 5,000 mcg by mouth daily.     Magnesium 400 MG CAPS Take by mouth.     methimazole  (TAPAZOLE ) 10 MG tablet Take 10 mg by  mouth daily. (Patient taking differently: Take 10 mg by mouth daily.)     metoprolol  tartrate (LOPRESSOR ) 25 MG tablet Take one-half tablet (12.5 mg total) by mouth 2 (two) times daily. 90 tablet 3   vitamin A 3 MG (10000 UNITS) capsule Take 10,000 Units by mouth daily.     Zinc 50 MG CAPS Take 50 mg by mouth daily.     No current facility-administered medications on file prior to visit.    Allergies  Allergen Reactions   Lamisil [Terbinafine] Other (See Comments)    REACTION: Lost taste    Azo [Phenazopyridine] Hives and Rash   Penicillins Hives   Septra [Sulfamethoxazole-Trimethoprim] Hives    Assessment/Plan:  1. Hyperlipidemia -  Hyperlipidemia LDL goal <70 Assessment: LDL-C above goal of less than 70 No longer on atorvastatin  due to achiness of bilateral  arms Even when patient was on atorvastatin  40 mg daily LDL-C was above goal Patient tries to exercise daily, generally exercises at least 5 days a week.  Does an indoor walking app, admits she could increase the intensity.  No resistance training Reviewed options for lowering LDL cholesterol, including ezetimibe, PCSK-9 inhibitors, bempedoic acid, inclisiran for rosuvastatin or pravastatin.  Discussed mechanisms of action, dosing, side effects and potential decreases in LDL cholesterol.  Also reviewed cost information and potential options for patient assistance.   Plan: Patient would like some more time to look into medication options.  I provided her with all the options in writing She will contact me with her decision Encouraged her to increase the intensity of her exercise (5 out of 10 intensity) and to add in some resistance training.  Chart of some sample exercises provided.  She does have some issues with balance due to Mnire's disease.  Recommended chair exercises if she felt more comfortable with this    Thank you,  Mihailo Sage D Darcus Edds, Pharm.Monika Annas, CPP [facility] of [facility] [address] [place], [place] [address]  Phone: [phone]; Fax: [phone]

## 2024-04-22 NOTE — Patient Instructions (Addendum)
 Repatha- Injection self administered every 14 days. Branded medications. Non statin (would be the most likely to get to goal by itself) ~ 60% reduction Nexlizet- Combo pill of Zetia and Nexletol- branded medication. Non-statin ~40% reduction Rosuvastatin 5mg  daily -up to 50% Pravastatin 40mg  - 30-50%  Please call me at [phone] or send me a mychart message with your decision

## 2024-04-22 NOTE — Assessment & Plan Note (Signed)
 Assessment: LDL-C above goal of less than 70 No longer on atorvastatin  due to achiness of bilateral arms Even when patient was on atorvastatin  40 mg daily LDL-C was above goal Patient tries to exercise daily, generally exercises at least 5 days a week.  Does an indoor walking app, admits she could increase the intensity.  No resistance training Reviewed options for lowering LDL cholesterol, including ezetimibe, PCSK-9 inhibitors, bempedoic acid, inclisiran for rosuvastatin or pravastatin.  Discussed mechanisms of action, dosing, side effects and potential decreases in LDL cholesterol.  Also reviewed cost information and potential options for patient assistance.   Plan: Patient would like some more time to look into medication options.  I provided her with all the options in writing She will contact me with her decision Encouraged her to increase the intensity of her exercise (5 out of 10 intensity) and to add in some resistance training.  Chart of some sample exercises provided.  She does have some issues with balance due to Mnire's disease.  Recommended chair exercises if she felt more comfortable with this

## 2024-04-27 DIAGNOSIS — E0500 Thyrotoxicosis with diffuse goiter without thyrotoxic crisis or storm: Secondary | ICD-10-CM | POA: Diagnosis not present

## 2024-04-27 DIAGNOSIS — H0102B Squamous blepharitis left eye, upper and lower eyelids: Secondary | ICD-10-CM | POA: Diagnosis not present

## 2024-04-27 DIAGNOSIS — H0102A Squamous blepharitis right eye, upper and lower eyelids: Secondary | ICD-10-CM | POA: Diagnosis not present

## 2024-04-27 DIAGNOSIS — H04123 Dry eye syndrome of bilateral lacrimal glands: Secondary | ICD-10-CM | POA: Diagnosis not present

## 2024-04-27 DIAGNOSIS — H0520 Unspecified exophthalmos: Secondary | ICD-10-CM | POA: Diagnosis not present

## 2024-04-27 DIAGNOSIS — H2513 Age-related nuclear cataract, bilateral: Secondary | ICD-10-CM | POA: Diagnosis not present

## 2024-07-05 DIAGNOSIS — E0500 Thyrotoxicosis with diffuse goiter without thyrotoxic crisis or storm: Secondary | ICD-10-CM | POA: Diagnosis not present

## 2024-07-05 DIAGNOSIS — R198 Other specified symptoms and signs involving the digestive system and abdomen: Secondary | ICD-10-CM | POA: Diagnosis not present

## 2024-07-05 DIAGNOSIS — E0590 Thyrotoxicosis, unspecified without thyrotoxic crisis or storm: Secondary | ICD-10-CM | POA: Diagnosis not present

## 2024-08-16 DIAGNOSIS — Z1212 Encounter for screening for malignant neoplasm of rectum: Secondary | ICD-10-CM | POA: Diagnosis not present

## 2024-08-16 DIAGNOSIS — Z1211 Encounter for screening for malignant neoplasm of colon: Secondary | ICD-10-CM | POA: Diagnosis not present

## 2024-08-20 LAB — COLOGUARD: COLOGUARD: NEGATIVE

## 2024-09-20 DIAGNOSIS — E0500 Thyrotoxicosis with diffuse goiter without thyrotoxic crisis or storm: Secondary | ICD-10-CM | POA: Diagnosis not present

## 2024-09-20 DIAGNOSIS — E785 Hyperlipidemia, unspecified: Secondary | ICD-10-CM | POA: Diagnosis not present

## 2024-09-20 DIAGNOSIS — E0590 Thyrotoxicosis, unspecified without thyrotoxic crisis or storm: Secondary | ICD-10-CM | POA: Diagnosis not present

## 2024-09-20 DIAGNOSIS — M8588 Other specified disorders of bone density and structure, other site: Secondary | ICD-10-CM | POA: Diagnosis not present

## 2024-09-21 ENCOUNTER — Other Ambulatory Visit: Payer: Self-pay

## 2024-09-21 ENCOUNTER — Ambulatory Visit: Payer: Self-pay | Admitting: Cardiology

## 2024-09-21 DIAGNOSIS — E785 Hyperlipidemia, unspecified: Secondary | ICD-10-CM

## 2024-09-21 DIAGNOSIS — I2510 Atherosclerotic heart disease of native coronary artery without angina pectoris: Secondary | ICD-10-CM

## 2024-10-25 DIAGNOSIS — M25561 Pain in right knee: Secondary | ICD-10-CM | POA: Diagnosis not present

## 2024-11-02 DIAGNOSIS — M25561 Pain in right knee: Secondary | ICD-10-CM | POA: Diagnosis not present

## 2024-11-02 DIAGNOSIS — S83419A Sprain of medial collateral ligament of unspecified knee, initial encounter: Secondary | ICD-10-CM | POA: Diagnosis not present

## 2024-11-03 DIAGNOSIS — M1711 Unilateral primary osteoarthritis, right knee: Secondary | ICD-10-CM | POA: Diagnosis not present

## 2024-11-03 DIAGNOSIS — M25561 Pain in right knee: Secondary | ICD-10-CM | POA: Diagnosis not present
